# Patient Record
Sex: Male | Born: 2000 | Race: White | Hispanic: No | Marital: Single | Smoking: Never smoker
Health system: Southern US, Community
[De-identification: ages and names within clinical notes are randomized; demographics above are authoritative.]

## PROBLEM LIST (undated history)

## (undated) DIAGNOSIS — F902 Attention-deficit hyperactivity disorder, combined type: Secondary | ICD-10-CM

## (undated) HISTORY — DX: Attention-deficit hyperactivity disorder, combined type: F90.2

## (undated) HISTORY — PX: FRACTURE SURGERY: SHX138

---
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
## 2020-04-06 ENCOUNTER — Ambulatory Visit: Payer: BC Managed Care – PPO | Attending: Internal Medicine

## 2020-04-06 DIAGNOSIS — Z23 Encounter for immunization: Secondary | ICD-10-CM

## 2020-04-06 NOTE — Progress Notes (Signed)
   Covid-19 Vaccination Clinic  Name:  Neil Lane    MRN: [id number] DOB: [birth year]  04/06/2020  [name] was observed post Covid-19 immunization for 15 minutes without incident. He was provided with Vaccine Information Sheet and instruction to access the V-Safe system.   [name] was instructed to call 911 with any severe reactions post vaccine: Marland Kitchen Difficulty breathing  . Swelling of face and throat  . A fast heartbeat  . A bad rash all over body  . Dizziness and weakness   Immunizations Administered    Name Date Dose VIS Date Route   Pfizer COVID-19 Vaccine 04/06/2020  3:03 PM 0.3 mL 01/14/2019 Intramuscular   Manufacturer: [employer], [employer]   Lot: [id number]   NDC: [address]-7

## 2020-04-27 ENCOUNTER — Ambulatory Visit: Payer: BC Managed Care – PPO | Attending: Internal Medicine

## 2020-04-27 DIAGNOSIS — Z23 Encounter for immunization: Secondary | ICD-10-CM

## 2020-04-27 NOTE — Progress Notes (Signed)
   Covid-19 Vaccination Clinic  Name:  Neil Lane    MRN: [id number] DOB: 02-Apr-2001  04/27/2020  Neil Lane was observed post Covid-19 immunization for 15 minutes without incident. He was provided with Vaccine Information Sheet and instruction to access the V-Safe system.   Neil Lane was instructed to call 911 with any severe reactions post vaccine: Marland Kitchen Difficulty breathing  . Swelling of face and throat  . A fast heartbeat  . A bad rash all over body  . Dizziness and weakness   Immunizations Administered    Name Date Dose VIS Date Route   Pfizer COVID-19 Vaccine 04/27/2020 10:31 AM 0.3 mL 01/14/2019 Intramuscular   Manufacturer: [employer], [employer]   Lot: [id number]   NDC: [address]-1

## 2020-06-13 ENCOUNTER — Other Ambulatory Visit: Payer: Self-pay

## 2020-06-13 ENCOUNTER — Encounter: Payer: Self-pay | Admitting: Emergency Medicine

## 2020-06-13 ENCOUNTER — Emergency Department
Admission: EM | Admit: 2020-06-13 | Discharge: 2020-06-13 | Disposition: A | Discharge status: Home / Self Care | Payer: BC Managed Care – PPO | Attending: Emergency Medicine | Admitting: Emergency Medicine

## 2020-06-13 DIAGNOSIS — Z5321 Procedure and treatment not carried out due to patient leaving prior to being seen by health care provider: Secondary | ICD-10-CM | POA: Insufficient documentation

## 2020-06-13 DIAGNOSIS — R21 Rash and other nonspecific skin eruption: Secondary | ICD-10-CM | POA: Diagnosis not present

## 2020-06-13 DIAGNOSIS — T7840XA Allergy, unspecified, initial encounter: Secondary | ICD-10-CM | POA: Diagnosis not present

## 2020-06-13 MED ORDER — DIPHENHYDRAMINE HCL 25 MG PO CAPS
50.0000 mg | ORAL_CAPSULE | Freq: Once | ORAL | Status: AC
  Administered 2020-06-13: 50 mg via ORAL
  Filled 2020-06-13: qty 2

## 2020-06-13 NOTE — ED Triage Notes (Addendum)
Patient with complaint of allergic reaction that started about an hour ago. Patient with rash to his chest and bilateral arms.

## 2020-09-10 ENCOUNTER — Other Ambulatory Visit: Payer: Self-pay

## 2020-09-10 ENCOUNTER — Emergency Department
Admission: EM | Admit: 2020-09-10 | Discharge: 2020-09-10 | Disposition: A | Discharge status: Home / Self Care | Payer: BC Managed Care – PPO | Attending: Emergency Medicine | Admitting: Emergency Medicine

## 2020-09-10 ENCOUNTER — Emergency Department: Payer: BC Managed Care – PPO

## 2020-09-10 DIAGNOSIS — J40 Bronchitis, not specified as acute or chronic: Secondary | ICD-10-CM | POA: Diagnosis not present

## 2020-09-10 DIAGNOSIS — R531 Weakness: Secondary | ICD-10-CM | POA: Diagnosis present

## 2020-09-10 LAB — BASIC METABOLIC PANEL
Anion gap: 10 (ref 5–15)
BUN: 12 mg/dL (ref 6–20)
CO2: 23 mmol/L (ref 22–32)
Calcium: 9.3 mg/dL (ref 8.9–10.3)
Chloride: 104 mmol/L (ref 98–111)
Creatinine, Ser: 1.08 mg/dL (ref 0.61–1.24)
GFR, Estimated: >60 mL/min (ref >60)
Glucose, Bld: 104 mg/dL — ABNORMAL HIGH (ref 70–99)
Potassium: 3.8 mmol/L (ref 3.5–5.1)
Sodium: 137 mmol/L (ref 135–145)

## 2020-09-10 LAB — CBC
HCT: 43.7 % (ref 39.0–52.0)
Hemoglobin: 15.3 g/dL (ref 13.0–17.0)
MCH: 29.8 pg (ref 26.0–34.0)
MCHC: 35 g/dL (ref 30.0–36.0)
MCV: 85 fL (ref 80.0–100.0)
Platelets: 293 10*3/uL (ref 150–400)
RBC: 5.14 MIL/uL (ref 4.22–5.81)
RDW: 12.5 % (ref 11.5–15.5)
WBC: 12.1 10*3/uL — ABNORMAL HIGH (ref 4.0–10.5)
nRBC: 0 % (ref 0.0–0.2)

## 2020-09-10 MED ORDER — AZITHROMYCIN 250 MG PO TABS
ORAL_TABLET | ORAL | 0 refills | Status: DC
Start: 2020-09-10 — End: 2022-01-26

## 2020-09-10 MED ORDER — ACETAMINOPHEN 325 MG PO TABS
650.0000 mg | ORAL_TABLET | Freq: Once | ORAL | Status: AC | PRN
  Administered 2020-09-10: 650 mg via ORAL
  Filled 2020-09-10: qty 2

## 2020-09-10 MED ORDER — ALBUTEROL SULFATE HFA 108 (90 BASE) MCG/ACT IN AERS: 2.0000 | INHALATION_SPRAY | Freq: Four times a day (QID) | RESPIRATORY_TRACT | 0 refills | Status: AC | PRN

## 2020-09-10 NOTE — Discharge Instructions (Addendum)
Take the antibiotics as prescribed  Try the inhaler for SOB or chest pressure  Drink plenty of fluids  Follow COVID-19 precautions/quarantine until your results return  Take tylenol and ibuprofen for pain, fevers

## 2020-09-10 NOTE — ED Triage Notes (Signed)
Pt to ED POV for chief complaint weakness x1 day. Was COVID tested at clinic today, pending results.  States has been having intermittent SHOB. RR Even and unlabored.  Denies CP

## 2020-09-10 NOTE — ED Notes (Signed)
Discharge intructions reviewed with patient, AOx4, denied pain or sob. Pt declined medication for elevated fever.

## 2020-09-10 NOTE — ED Provider Notes (Signed)
[facility] Emergency Department Provider Note  ____________________________________________   First MD Initiated Contact with Patient 09/10/20 1953     (approximate)  I have reviewed the triage vital signs and the nursing notes.   HISTORY  Chief Complaint Weakness    HPI Neil Lane is a 19 y.o. male  With h/o ADHD here with SOB, chest pressure. Pt reports that over the past several days, he has had runny nose, mild fatigue. Over the past 24 hours, he's developed cough, mild chest pressure, and fatigue with chills. He has a possible COVID contact at work. He did receive two prior vaccines. Reports his pressure has improved since arriving. No h/o CAD or lung problems, but did use an inhaler when younger. He does not smoke. He ahd some mild n/v earlier today as well which has resolved. No abd pain. No dysuria. He was given tylenol in waiting room which has helped his symptoms.       Past Medical History:  Diagnosis Date  . ADHD (attention deficit hyperactivity disorder), combined type     There are no problems to display for this patient.   Past Surgical History:  Procedure Laterality Date  . FRACTURE SURGERY      Prior to Admission medications   Medication Sig Start Date End Date Taking? Authorizing Provider  albuterol (VENTOLIN HFA) 108 (90 Base) MCG/ACT inhaler Inhale 2 puffs into the lungs every 6 (six) hours as needed for wheezing or shortness of breath. 09/10/20   Shaune Pollack, MD  azithromycin (ZITHROMAX Z-PAK) 250 MG tablet Take 2 tablets (500 mg) on  Day 1,  followed by 1 tablet (250 mg) once daily on Days 2 through 5. 09/10/20   Shaune Pollack, MD  cloNIDine (CATAPRES) 0.2 MG tablet Take 0.2 mg by mouth once.    [provider]  dexmethylphenidate (FOCALIN XR) 20 MG 24 hr capsule Take 20 mg by mouth daily.    [provider]  dexmethylphenidate (FOCALIN) 5 MG tablet Take 5 mg by mouth 2 (two) times daily.     [provider]    Allergies Tricyclic antidepressants  No family history on file.  Social History Social History   Tobacco Use  . Smoking status: Never Smoker  . Smokeless tobacco: Never Used  Substance Use Topics  . Alcohol use: No  . Drug use: No    Review of Systems  Review of Systems  Constitutional: Positive for fatigue. Negative for chills and fever.  HENT: Negative for sore throat.   Respiratory: Positive for cough and shortness of breath.   Cardiovascular: Positive for chest pain.  Gastrointestinal: Positive for nausea. Negative for abdominal pain.  Genitourinary: Negative for flank pain.  Musculoskeletal: Negative for neck pain.  Skin: Negative for rash and wound.  Allergic/Immunologic: Negative for immunocompromised state.  Neurological: Negative for weakness and numbness.  Hematological: Does not bruise/bleed easily.  All other systems reviewed and are negative.    ____________________________________________  PHYSICAL EXAM:      VITAL SIGNS: ED Triage Vitals  Enc Vitals Group     BP 09/10/20 1753 (!) 122/96     Pulse Rate 09/10/20 1753 92     Resp 09/10/20 1753 20     Temp 09/10/20 1753 (!) 101.4 F (38.6 C)     Temp Source 09/10/20 1753 Oral     SpO2 09/10/20 1753 95 %     Weight 09/10/20 1754 228 lb (103.4 kg)     Height 09/10/20  1754 5\' 8"  (1.727 m)     Head Circumference --      Peak Flow --      Pain Score 09/10/20 1754 0     Pain Loc --      Pain Edu? --      Excl. in GC? --      Physical Exam Vitals and nursing note reviewed.  Constitutional:      General: He is not in acute distress.    Appearance: He is well-developed.  HENT:     Head: Normocephalic and atraumatic.  Eyes:     Conjunctiva/sclera: Conjunctivae normal.  Cardiovascular:     Rate and Rhythm: Normal rate and regular rhythm.     Heart sounds: Normal heart sounds. No murmur heard.  No friction rub.  Pulmonary:     Effort: Pulmonary effort is normal. No  respiratory distress.     Breath sounds: Wheezing (slight, expiratory) present. No rales.  Abdominal:     General: There is no distension.     Palpations: Abdomen is soft.     Tenderness: There is no abdominal tenderness.  Musculoskeletal:     Cervical back: Neck supple.  Skin:    General: Skin is warm.     Capillary Refill: Capillary refill takes less than 2 seconds.  Neurological:     Mental Status: He is alert and oriented to person, place, and time.     Motor: No abnormal muscle tone.       ____________________________________________   LABS (all labs ordered are listed, but only abnormal results are displayed)  Labs Reviewed  BASIC METABOLIC PANEL - Abnormal; Notable for the following components:      Result Value   Glucose, Bld 104 (*)    All other components within normal limits  CBC - Abnormal; Notable for the following components:   WBC 12.1 (*)    All other components within normal limits  URINALYSIS, COMPLETE (UACMP) WITH MICROSCOPIC    ____________________________________________  EKG: Sinus tachycardia, VR 103. PR 136, QRS 84, QTc 387. Rightward axis, o/w no significant abnormalities. ________________________________________  RADIOLOGY All imaging, including plain films, CT scans, and ultrasounds, independently reviewed by me, and interpretations confirmed via formal radiology reads.  ED MD interpretation:   CXR: no active cardiopulm dz, possible deformity of R proximal humerus  Official radiology report(s): DG Chest 2 View  Result Date: 09/10/2020 CLINICAL DATA:  Initial evaluation for acute shortness of breath. EXAM: CHEST - 2 VIEW COMPARISON:  None available. FINDINGS: The cardiac and mediastinal silhouettes are within normal limits. The lungs are normally inflated. No airspace consolidation, pleural effusion, or pulmonary edema. No pneumothorax. No acute osseous abnormality. Deformity and thickening noted about the partially visualized proximal right  humerus, indeterminate. IMPRESSION: 1. No active cardiopulmonary disease. 2. Deformity and thickening about the partially visualized proximal right humerus, indeterminate. Correlation with history and physical exam recommended. Further assessment with dedicated radiograph could be performed as clinically warranted. Electronically Signed   By: 09/12/2020 M.D.   On: 09/10/2020 19:05   DG Humerus Right  Result Date: 09/10/2020 CLINICAL DATA:  Abnormal humeral head on x-ray EXAM: RIGHT HUMERUS - 2+ VIEW COMPARISON:  None. FINDINGS: There is no evidence of fracture or dislocation. There is slight osseous protrude rinse and healed deformity seen within the proximal humerus. No focal overlying soft tissue swelling. IMPRESSION: Probable healed fracture deformity of the proximal humerus within osseous protuberance which could be exostosis versus partially resected osteochondroma given the patient's  history. Electronically Signed   By: Jonna Clark M.D.   On: 09/10/2020 20:47    ____________________________________________  PROCEDURES   Procedure(s) performed (including Critical Care):  Procedures  ____________________________________________  INITIAL IMPRESSION / MDM / ASSESSMENT AND PLAN / ED COURSE  As part of my medical decision making, I reviewed the following data within the electronic MEDICAL RECORD NUMBER Nursing notes reviewed and incorporated, Old chart reviewed, Notes from prior ED visits, and [place] Controlled Substance Database       *Neil SPRAY V. was evaluated in Emergency Department on 09/10/2020 for the symptoms described in the history of present illness. He was evaluated in the context of the global COVID-19 pandemic, which necessitated consideration that the patient might be at risk for infection with the SARS-CoV-2 virus that causes COVID-19. Institutional protocols and algorithms that pertain to the evaluation of patients at risk for COVID-19 are in a state of rapid change  based on information released by regulatory bodies including the CDC and federal and state organizations. These policies and algorithms were followed during the patient's care in the ED.  Some ED evaluations and interventions may be delayed as a result of limited staffing during the pandemic.*     Medical Decision Making:  19 yo M here with fever, cough, fatigue. Suspect COVID-19 vs viral URI vs early bronchitis/CAP. He is non-toxic, well appearing with sats >95% on RA. CXR shows no focal PNA. He has incidental note of irregular humerus which I suspect is from his h/o osteochondroma. No concerning feature on XR. Otherwise, he has a mild leukocytosis but normal labs otherwise. Renal function is normal. Will tx for early bronchitis/CAP, d/c with outpt follow-up. Given absence of hypoxia, will hold on steroids at this time. Will trial albuterol given his mild wheezing.  ____________________________________________  FINAL CLINICAL IMPRESSION(S) / ED DIAGNOSES  Final diagnoses:  Bronchitis     MEDICATIONS GIVEN DURING THIS VISIT:  Medications  acetaminophen (TYLENOL) tablet 650 mg (650 mg Oral Given 09/10/20 1801)     ED Discharge Orders         Ordered    azithromycin (ZITHROMAX Z-PAK) 250 MG tablet        09/10/20 2125    albuterol (VENTOLIN HFA) 108 (90 Base) MCG/ACT inhaler  Every 6 hours PRN        09/10/20 2125           Note:  This document was prepared using Dragon voice recognition software and may include unintentional dictation errors.   Shaune Pollack, MD 09/10/20 2125

## 2022-01-26 ENCOUNTER — Encounter: Payer: Self-pay | Admitting: Urgent Care

## 2022-01-26 ENCOUNTER — Other Ambulatory Visit: Payer: Self-pay

## 2022-01-26 ENCOUNTER — Ambulatory Visit: Payer: Self-pay

## 2022-01-26 ENCOUNTER — Ambulatory Visit
Admission: EM | Admit: 2022-01-26 | Discharge: 2022-01-26 | Disposition: A | Discharge status: Home / Self Care | Payer: BC Managed Care – PPO

## 2022-01-26 DIAGNOSIS — R0602 Shortness of breath: Secondary | ICD-10-CM | POA: Diagnosis not present

## 2022-01-26 DIAGNOSIS — R030 Elevated blood-pressure reading, without diagnosis of hypertension: Secondary | ICD-10-CM

## 2022-01-26 DIAGNOSIS — Z833 Family history of diabetes mellitus: Secondary | ICD-10-CM | POA: Diagnosis not present

## 2022-01-26 DIAGNOSIS — R052 Subacute cough: Secondary | ICD-10-CM | POA: Diagnosis not present

## 2022-01-26 NOTE — ED Triage Notes (Signed)
Pt reports cough and shortness of breath x 16 hrs after he was burning some papers and trash.  ?
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
## 2022-01-30 ENCOUNTER — Encounter (HOSPITAL_COMMUNITY): Payer: Self-pay | Admitting: *Deleted

## 2022-01-30 ENCOUNTER — Emergency Department (HOSPITAL_COMMUNITY)
Admission: EM | Admit: 2022-01-30 | Discharge: 2022-01-30 | Disposition: A | Discharge status: Home / Self Care | Payer: BC Managed Care – PPO | Attending: Emergency Medicine | Admitting: Emergency Medicine

## 2022-01-30 DIAGNOSIS — R059 Cough, unspecified: Secondary | ICD-10-CM | POA: Insufficient documentation

## 2022-01-30 DIAGNOSIS — R112 Nausea with vomiting, unspecified: Secondary | ICD-10-CM | POA: Diagnosis present

## 2022-01-30 DIAGNOSIS — R197 Diarrhea, unspecified: Secondary | ICD-10-CM | POA: Diagnosis not present

## 2022-01-30 DIAGNOSIS — R109 Unspecified abdominal pain: Secondary | ICD-10-CM | POA: Diagnosis not present

## 2022-01-30 DIAGNOSIS — D72829 Elevated white blood cell count, unspecified: Secondary | ICD-10-CM | POA: Insufficient documentation

## 2022-01-30 DIAGNOSIS — M549 Dorsalgia, unspecified: Secondary | ICD-10-CM | POA: Diagnosis not present

## 2022-01-30 DIAGNOSIS — M791 Myalgia, unspecified site: Secondary | ICD-10-CM | POA: Insufficient documentation

## 2022-01-30 DIAGNOSIS — Z20822 Contact with and (suspected) exposure to covid-19: Secondary | ICD-10-CM | POA: Diagnosis not present

## 2022-01-30 DIAGNOSIS — R000 Tachycardia, unspecified: Secondary | ICD-10-CM | POA: Insufficient documentation

## 2022-01-30 LAB — URINALYSIS, ROUTINE W REFLEX MICROSCOPIC
Bacteria, UA: NONE SEEN
Bilirubin Urine: NEGATIVE
Glucose, UA: NEGATIVE mg/dL
Hgb urine dipstick: NEGATIVE
Ketones, ur: NEGATIVE mg/dL
Leukocytes,Ua: NEGATIVE
Nitrite: NEGATIVE
Protein, ur: 30 mg/dL — AB
Specific Gravity, Urine: 1.032 — ABNORMAL HIGH (ref 1.005–1.030)
pH: 5 (ref 5.0–8.0)

## 2022-01-30 LAB — CBC WITH DIFFERENTIAL/PLATELET
Abs Immature Granulocytes: 0.05 10*3/uL (ref 0.00–0.07)
Basophils Absolute: 0 10*3/uL (ref 0.0–0.1)
Basophils Relative: 0 %
Eosinophils Absolute: 0 10*3/uL (ref 0.0–0.5)
Eosinophils Relative: 0 %
HCT: 45.9 % (ref 39.0–52.0)
Hemoglobin: 16.1 g/dL (ref 13.0–17.0)
Immature Granulocytes: 0 %
Lymphocytes Relative: 3 %
Lymphs Abs: 0.4 10*3/uL — ABNORMAL LOW (ref 0.7–4.0)
MCH: 30.6 pg (ref 26.0–34.0)
MCHC: 35.1 g/dL (ref 30.0–36.0)
MCV: 87.1 fL (ref 80.0–100.0)
Monocytes Absolute: 0.6 10*3/uL (ref 0.1–1.0)
Monocytes Relative: 4 %
Neutro Abs: 12.1 10*3/uL — ABNORMAL HIGH (ref 1.7–7.7)
Neutrophils Relative %: 93 %
Platelets: 287 10*3/uL (ref 150–400)
RBC: 5.27 MIL/uL (ref 4.22–5.81)
RDW: 12.7 % (ref 11.5–15.5)
WBC: 13.2 10*3/uL — ABNORMAL HIGH (ref 4.0–10.5)
nRBC: 0 % (ref 0.0–0.2)

## 2022-01-30 LAB — COMPREHENSIVE METABOLIC PANEL
ALT: 46 U/L — ABNORMAL HIGH (ref 0–44)
AST: 35 U/L (ref 15–41)
Albumin: 4.5 g/dL (ref 3.5–5.0)
Alkaline Phosphatase: 80 U/L (ref 38–126)
Anion gap: 11 (ref 5–15)
BUN: 22 mg/dL — ABNORMAL HIGH (ref 6–20)
CO2: 23 mmol/L (ref 22–32)
Calcium: 9.2 mg/dL (ref 8.9–10.3)
Chloride: 105 mmol/L (ref 98–111)
Creatinine, Ser: 1.02 mg/dL (ref 0.61–1.24)
GFR, Estimated: >60 mL/min (ref >60)
Glucose, Bld: 106 mg/dL — ABNORMAL HIGH (ref 70–99)
Potassium: 4.1 mmol/L (ref 3.5–5.1)
Sodium: 139 mmol/L (ref 135–145)
Total Bilirubin: 1.3 mg/dL — ABNORMAL HIGH (ref 0.3–1.2)
Total Protein: 7.6 g/dL (ref 6.5–8.1)

## 2022-01-30 LAB — LIPASE, BLOOD: Lipase: 27 U/L (ref 11–51)

## 2022-01-30 LAB — RESP PANEL BY RT-PCR (FLU A&B, COVID) ARPGX2
Influenza A by PCR: NEGATIVE
Influenza B by PCR: NEGATIVE
SARS Coronavirus 2 by RT PCR: NEGATIVE

## 2022-01-30 MED ORDER — ONDANSETRON HCL 4 MG PO TABS: 4.0000 mg | ORAL_TABLET | Freq: Four times a day (QID) | ORAL | 0 refills | Status: DC

## 2022-01-30 MED ORDER — SODIUM CHLORIDE 0.9 % IV BOLUS
1000.0000 mL | Freq: Once | INTRAVENOUS | Status: AC
  Administered 2022-01-30: 1000 mL via INTRAVENOUS

## 2022-01-30 MED ORDER — ONDANSETRON HCL 4 MG/2ML IJ SOLN
4.0000 mg | Freq: Once | INTRAMUSCULAR | Status: AC
  Administered 2022-01-30: 4 mg via INTRAVENOUS
  Filled 2022-01-30: qty 2

## 2022-01-30 NOTE — ED Triage Notes (Signed)
Fever with nausea and vomting ?

## 2022-01-30 NOTE — Discharge Instructions (Signed)
You were seen in the emergency department today for nausea, vomiting and diarrhea.  While you are here we did lab work which was relatively reassuring.  We also gave you fluids and Zofran which seemed to improve your symptoms greatly.  Please return to the emergency department for worsening abdominal pain with fevers, nausea and vomiting despite using Zofran.  Additionally return if you are not able to drink water. ?

## 2022-01-30 NOTE — ED Notes (Signed)
Vitals entered in error. 

## 2022-01-30 NOTE — ED Provider Notes (Cosign Needed)
[facility] EMERGENCY DEPARTMENT Provider Note   CSN: [id number] Arrival date & time: 01/30/22  1327     History  Chief Complaint  Patient presents with   nausea and vomiting    Neil Lane. is a 21 y.o. male.  With no pertinent past medical history presents to the emergency department with nausea and vomiting.  Patient states that symptoms began this morning.  He states that he woke up with abdominal, and back pain.  He states that he has also had numerous episodes of nausea, vomiting as well as body aches and cough.  Additionally has had few episodes of diarrhea.  Denies blood in his stool.  He denies fever at home but presents with temp of 99.7.  Denies palpitations, lower extremity swelling, shortness of breath.  He states that he ate dominoes last night as well as his sister who he states was somewhat ill on her stomach however does not have symptoms as bad as him.  HPI     Home Medications Prior to Admission medications   Medication Sig Start Date End Date Taking? Authorizing Provider  albuterol (VENTOLIN HFA) 108 (90 Base) MCG/ACT inhaler Inhale 2 puffs into the lungs every 6 (six) hours as needed for wheezing or shortness of breath. 09/10/20   Shaune Pollack, MD  cloNIDine (CATAPRES) 0.2 MG tablet Take 0.2 mg by mouth once.    [provider]  dexmethylphenidate (FOCALIN XR) 20 MG 24 hr capsule Take 20 mg by mouth daily.    [provider]  dexmethylphenidate (FOCALIN) 5 MG tablet Take 5 mg by mouth 2 (two) times daily.    [provider]      Allergies    Oxcarbazepine and Tricyclic antidepressants    Review of Systems   Review of Systems  Constitutional:  Positive for appetite change.  HENT:  Negative for congestion and sore throat.   Respiratory:  Positive for cough and shortness of breath.   Cardiovascular:  Negative for palpitations and leg swelling.  Gastrointestinal:  Positive for abdominal pain, diarrhea, nausea and vomiting.  Negative for blood in stool.  Genitourinary:  Negative for dysuria.  Musculoskeletal:  Positive for myalgias.  Neurological:  Negative for dizziness, syncope and light-headedness.  All other systems reviewed and are negative.  Physical Exam Updated Vital Signs BP (!) 145/71    Pulse (!) 130    Temp 99.7 F (37.6 C) (Oral)    Resp (!) 30    Wt (!) 233.6 kg    SpO2 100%    BMI 78.30 kg/m  Physical Exam Vitals and nursing note reviewed.  Constitutional:      General: He is not in acute distress.    Appearance: Normal appearance. He is ill-appearing. He is not toxic-appearing.  HENT:     Head: Normocephalic and atraumatic.     Nose: Nose normal.     Mouth/Throat:     Mouth: Mucous membranes are moist.     Pharynx: Oropharynx is clear. No posterior oropharyngeal erythema.  Eyes:     General: No scleral icterus.    Extraocular Movements: Extraocular movements intact.     Conjunctiva/sclera: Conjunctivae normal.  Cardiovascular:     Rate and Rhythm: Regular rhythm. Tachycardia present.     Pulses: Normal pulses.     Heart sounds: Normal heart sounds. No murmur heard. Pulmonary:     Effort: Pulmonary effort is normal. No respiratory distress.     Breath sounds: Normal breath sounds.  Abdominal:  General: Bowel sounds are normal. There is no distension.     Palpations: Abdomen is soft.     Tenderness: There is no abdominal tenderness. There is no guarding.  Musculoskeletal:        General: Normal range of motion.     Cervical back: Neck supple. No tenderness.  Skin:    General: Skin is warm and dry.     Capillary Refill: Capillary refill takes less than 2 seconds.  Neurological:     General: No focal deficit present.     Mental Status: He is alert and oriented to person, place, and time. Mental status is at baseline.  Psychiatric:        Mood and Affect: Mood normal.        Behavior: Behavior normal.        Thought Content: Thought content normal.        Judgment: Judgment  normal.    ED Results / Procedures / Treatments   Labs (all labs ordered are listed, but only abnormal results are displayed) Labs Reviewed  COMPREHENSIVE METABOLIC PANEL - Abnormal; Notable for the following components:      Result Value   Glucose, Bld 106 (*)    BUN 22 (*)    ALT 46 (*)    Total Bilirubin 1.3 (*)    All other components within normal limits  CBC WITH DIFFERENTIAL/PLATELET - Abnormal; Notable for the following components:   WBC 13.2 (*)    Neutro Abs 12.1 (*)    Lymphs Abs 0.4 (*)    All other components within normal limits  URINALYSIS, ROUTINE W REFLEX MICROSCOPIC - Abnormal; Notable for the following components:   Specific Gravity, Urine 1.032 (*)    Protein, ur 30 (*)    All other components within normal limits  RESP PANEL BY RT-PCR (FLU A&B, COVID) ARPGX2  LIPASE, BLOOD   EKG None  Radiology No results found.  Procedures Procedures   Medications Ordered in ED Medications  sodium chloride 0.9 % bolus 1,000 mL (0 mLs Intravenous Stopped 01/30/22 1634)  ondansetron (ZOFRAN) injection 4 mg (4 mg Intravenous Given 01/30/22 1510)    ED Course/ Medical Decision Making/ A&P                           Medical Decision Making Amount and/or Complexity of Data Reviewed Labs: ordered.  Risk Prescription drug management.  This patient presents to the ED for concern of abdominal pain, this involves an extensive number of treatment options, and is a complaint that carries with it a high risk of complications and morbidity.  The differential diagnosis includes hepatobiliary disease, pancreatitis, PUD, pneumonia, hepatitis, pyelonephritis, appendicitis, vascular catastrophe, bowel obstruction, viscus perforation, torsion, diverticulitis   Co morbidities that complicate the patient evaluation None  Additional history obtained:  Additional history obtained from: None External records from outside source obtained and reviewed including: Previous ED  visits  Lab Results: I personally ordered, reviewed, and interpreted labs. Pertinent results include: CMP with very mild elevated T. bili to 1.3, no AKI, transaminitis CBC with leukocytosis to 13.2, likely reactive COVID and flu negative Lipase 27, negative UA negative  Medications  I ordered medication including Zofran and fluids for vomiting and dehydration Reevaluation of the patient after medication shows that patient resolved  Tests Considered: CT abdomen pelvis with contrast  ED Course: 21 year old male who presents emergency department with nausea and vomiting and diarrhea.  His abdominal exam is  without peritoneal signs.  There is no evidence of an acute abdomen.  He is well-appearing.  Given his work-up I have low suspicion for acute hepatobiliary disease including acute cholecystitis or cholangitis.  Lipase is negative, doubt acute pancreatitis.  Symptoms are not consistent with PUD.  No cough, fever to suspect pneumonia.  CMP without transaminitis so doubt hepatitis.  UA is clean with no flank pain, fevers concerning for pyelonephritis. Would be atypical appendicitis however do not think his symptoms are consistent with this at this time.  Doubt vascular catastrophe, bowel obstruction, viscus perforation, diverticulitis based on symptoms.  Symptoms are also not consistent with testicular torsion.  Likely has viral gastroenteritis.  He was given 1 L IV fluids as well as 4 mg of Zofran with significant improvement in his symptoms.  Given his improvement in symptoms, will not scan him at this time.  He was also p.o. trialed without difficulty.  He states that he is feeling much improved.  We will give him a prescription for Zofran for home.  I have given him return precautions for worsening abdominal pain with fever or intractable nausea and vomiting.  He verbalized understanding.  After consideration of the diagnostic results and the patients response to treatment, I feel that the  patent would benefit from discharge. The patient has been appropriately medically screened and/or stabilized in the ED. I have low suspicion for any other emergent medical condition which would require further screening, evaluation or treatment in the ED or require inpatient management. The patient is overall well appearing and non-toxic in appearance. They are hemodynamically stable at time of discharge.   Final Clinical Impression(s) / ED Diagnoses Final diagnoses:  Nausea and vomiting, unspecified vomiting type    Rx / DC Orders ED Discharge Orders          Ordered    ondansetron (ZOFRAN) 4 MG tablet  Every 6 hours        01/30/22 1709              Cristopher Peru, PA-C 01/30/22 1747

## 2022-02-25 ENCOUNTER — Emergency Department (HOSPITAL_COMMUNITY)
Admission: EM | Admit: 2022-02-25 | Discharge: 2022-02-25 | Disposition: A | Discharge status: Home / Self Care | Payer: BC Managed Care – PPO | Attending: Emergency Medicine | Admitting: Emergency Medicine

## 2022-02-25 ENCOUNTER — Encounter (HOSPITAL_COMMUNITY): Payer: Self-pay

## 2022-02-25 ENCOUNTER — Other Ambulatory Visit: Payer: Self-pay

## 2022-02-25 DIAGNOSIS — R197 Diarrhea, unspecified: Secondary | ICD-10-CM | POA: Diagnosis not present

## 2022-02-25 DIAGNOSIS — R109 Unspecified abdominal pain: Secondary | ICD-10-CM | POA: Diagnosis not present

## 2022-02-25 DIAGNOSIS — R7401 Elevation of levels of liver transaminase levels: Secondary | ICD-10-CM | POA: Insufficient documentation

## 2022-02-25 DIAGNOSIS — R112 Nausea with vomiting, unspecified: Secondary | ICD-10-CM | POA: Insufficient documentation

## 2022-02-25 LAB — COMPREHENSIVE METABOLIC PANEL
ALT: 57 U/L — ABNORMAL HIGH (ref 0–44)
AST: 34 U/L (ref 15–41)
Albumin: 4.7 g/dL (ref 3.5–5.0)
Alkaline Phosphatase: 83 U/L (ref 38–126)
Anion gap: 8 (ref 5–15)
BUN: 18 mg/dL (ref 6–20)
CO2: 24 mmol/L (ref 22–32)
Calcium: 9.4 mg/dL (ref 8.9–10.3)
Chloride: 107 mmol/L (ref 98–111)
Creatinine, Ser: 0.91 mg/dL (ref 0.61–1.24)
GFR, Estimated: >60 mL/min (ref >60)
Glucose, Bld: 99 mg/dL (ref 70–99)
Potassium: 4 mmol/L (ref 3.5–5.1)
Sodium: 139 mmol/L (ref 135–145)
Total Bilirubin: 1.2 mg/dL (ref 0.3–1.2)
Total Protein: 7.8 g/dL (ref 6.5–8.1)

## 2022-02-25 LAB — CBC WITH DIFFERENTIAL/PLATELET
Abs Immature Granulocytes: 0.05 10*3/uL (ref 0.00–0.07)
Basophils Absolute: 0 10*3/uL (ref 0.0–0.1)
Basophils Relative: 0 %
Eosinophils Absolute: 0.1 10*3/uL (ref 0.0–0.5)
Eosinophils Relative: 1 %
HCT: 46.9 % (ref 39.0–52.0)
Hemoglobin: 16.4 g/dL (ref 13.0–17.0)
Immature Granulocytes: 0 %
Lymphocytes Relative: 10 %
Lymphs Abs: 1.2 10*3/uL (ref 0.7–4.0)
MCH: 29.7 pg (ref 26.0–34.0)
MCHC: 35 g/dL (ref 30.0–36.0)
MCV: 85 fL (ref 80.0–100.0)
Monocytes Absolute: 0.6 10*3/uL (ref 0.1–1.0)
Monocytes Relative: 5 %
Neutro Abs: 10.3 10*3/uL — ABNORMAL HIGH (ref 1.7–7.7)
Neutrophils Relative %: 84 %
Platelets: 310 10*3/uL (ref 150–400)
RBC: 5.52 MIL/uL (ref 4.22–5.81)
RDW: 12.8 % (ref 11.5–15.5)
WBC: 12.3 10*3/uL — ABNORMAL HIGH (ref 4.0–10.5)
nRBC: 0 % (ref 0.0–0.2)

## 2022-02-25 LAB — LIPASE, BLOOD: Lipase: 30 U/L (ref 11–51)

## 2022-02-25 MED ORDER — LACTATED RINGERS IV BOLUS
1000.0000 mL | Freq: Once | INTRAVENOUS | Status: AC
  Administered 2022-02-25: 1000 mL via INTRAVENOUS

## 2022-02-25 MED ORDER — ONDANSETRON HCL 4 MG PO TABS: 4.0000 mg | ORAL_TABLET | Freq: Four times a day (QID) | ORAL | 0 refills | Status: DC

## 2022-02-25 MED ORDER — ONDANSETRON HCL 4 MG/2ML IJ SOLN
4.0000 mg | Freq: Once | INTRAMUSCULAR | Status: AC
  Administered 2022-02-25: 4 mg via INTRAVENOUS
  Filled 2022-02-25: qty 2

## 2022-02-25 MED ORDER — LOPERAMIDE HCL 2 MG PO CAPS
4.0000 mg | ORAL_CAPSULE | Freq: Once | ORAL | Status: AC
  Administered 2022-02-25: 4 mg via ORAL
  Filled 2022-02-25: qty 2

## 2022-02-25 NOTE — Discharge Instructions (Signed)
Take loperamide (Imodium A-D) as needed for diarrhea. ?

## 2022-02-25 NOTE — ED Triage Notes (Signed)
Pt arrived via POV c/o N/V/D with abdominal pain. Pt ambulatory to room for Triage and able to provide urine specimen. Pt reports symptoms began last night @ apprx 2230. Pt reports taking Pepto Bismul PTA without relief from symptoms. ?

## 2022-02-25 NOTE — ED Provider Notes (Signed)
?[facility] EMERGENCY DEPARTMENT ?Provider Note ? ? ?CSN: [id number] ?Arrival date & time: 02/25/22  0156 ? ?  ? ?History ? ?Chief Complaint  ?Patient presents with  ? Abdominal Pain  ? ? ?Belva Bertin is a 21 y.o. male. ? ?The history is provided by the patient.  ?Abdominal Pain ?He had onset about 10 PM nausea, vomiting, diarrhea.  There was some associated abdominal pain which has resolved.  He estimates more than 10 episodes of diarrhea, numerous episodes of emesis.  He denies fever, chills, sweats.  He denies any sick contacts but states that he ate something from [employer] and he was worried that the locations cleanliness was not adequate.  He did take Pepto-Bismol at home without any benefit. ?  ?Home Medications ?Prior to Admission medications   ?Medication Sig Start Date End Date Taking? Authorizing Provider  ?albuterol (VENTOLIN HFA) 108 (90 Base) MCG/ACT inhaler Inhale 2 puffs into the lungs every 6 (six) hours as needed for wheezing or shortness of breath. 09/10/20   Shaune Pollack, MD  ?cloNIDine (CATAPRES) 0.2 MG tablet Take 0.2 mg by mouth once.    [provider]  ?dexmethylphenidate (FOCALIN XR) 20 MG 24 hr capsule Take 20 mg by mouth daily.    [provider]  ?dexmethylphenidate (FOCALIN) 5 MG tablet Take 5 mg by mouth 2 (two) times daily.    [provider]  ?ondansetron (ZOFRAN) 4 MG tablet Take 1 tablet (4 mg total) by mouth every 6 (six) hours. 01/30/22   Cristopher Peru, PA-C  ?   ? ?Allergies    ?Oxcarbazepine and Tricyclic antidepressants   ? ?Review of Systems   ?Review of Systems  ?Gastrointestinal:  Positive for abdominal pain.  ?All other systems reviewed and are negative. ? ?Physical Exam ?Updated Vital Signs ?BP 137/65 (BP Location: Left Arm)   Pulse 70   Temp 98.8 ?F (37.1 ?C) (Oral)   Resp 18   Ht 5\' 8"  (1.727 m)   Wt 106.6 kg   SpO2 100%   BMI 35.73 kg/m?  ?Physical Exam ?Vitals and nursing note reviewed.  ?21 year old male, resting comfortably  and in no acute distress. Vital signs are normal. Oxygen saturation is 100%, which is normal. ?Head is normocephalic and atraumatic. PERRLA, EOMI. Oropharynx is clear. ?Neck is nontender and supple without adenopathy or JVD. ?Back is nontender and there is no CVA tenderness. ?Lungs are clear without rales, wheezes, or rhonchi. ?Chest is nontender. ?Heart has regular rate and rhythm without murmur. ?Abdomen is soft, flat, nontender without masses or hepatosplenomegaly and peristalsis is normoactive. ?Extremities have no cyanosis or edema, full range of motion is present. ?Skin is warm and dry without rash. ?Neurologic: Mental status is normal, cranial nerves are intact, moves all extremities equally. ? ?ED Results / Procedures / Treatments   ?Labs ?(all labs ordered are listed, but only abnormal results are displayed) ?Labs Reviewed  ?COMPREHENSIVE METABOLIC PANEL  ?LIPASE, BLOOD  ?CBC WITH DIFFERENTIAL/PLATELET  ? ?Procedures ?Procedures  ? ? ?Medications Ordered in ED ?Medications  ?ondansetron (ZOFRAN) injection 4 mg (has no administration in time range)  ?lactated ringers bolus 1,000 mL (has no administration in time range)  ?loperamide (IMODIUM) capsule 4 mg (has no administration in time range)  ? ? ?ED Course/ Medical Decision Making/ A&P ?  ?                        ?Medical Decision Making ?Amount  and/or Complexity of Data Reviewed ?Labs: ordered. ? ?Risk ?Prescription drug management. ? ? ?Nausea, vomiting, diarrhea and pattern strongly suggestive of either viral gastroenteritis or food poisoning.  No red flags to suggest more serious pathology such as bowel obstruction, appendicitis, diverticulitis, pancreatitis.  We will check screening labs including lipase and hepatic function studies and give IV fluids.  He will be given ondansetron for nausea, loperamide for diarrhea.  Old records are reviewed, and he had an ED visit for similar complaints about 1 month ago. ? ?Labs show mildly elevated AST, not  significantly changed from recent value.  He feels much better after above-noted treatment.  He is discharged with prescription for ondansetron and told to use over-the-counter loperamide as needed for diarrhea. ? ?Final Clinical Impression(s) / ED Diagnoses ?Final diagnoses:  ?Nausea vomiting and diarrhea  ?Elevated AST (SGOT)  ? ? ?Rx / DC Orders ?ED Discharge Orders   ? ?      Ordered  ?  ondansetron (ZOFRAN) 4 MG tablet  Every 6 hours       ? 02/25/22 0433  ? ?  ?  ? ?  ? ? ?  ?Dione Booze, MD ?02/25/22 [phone] ? ?

## 2022-04-20 ENCOUNTER — Other Ambulatory Visit: Payer: Self-pay

## 2022-04-20 ENCOUNTER — Emergency Department (HOSPITAL_COMMUNITY)
Admission: EM | Admit: 2022-04-20 | Discharge: 2022-04-21 | Disposition: A | Discharge status: Home / Self Care | Payer: BC Managed Care – PPO | Attending: Emergency Medicine | Admitting: Emergency Medicine

## 2022-04-20 ENCOUNTER — Encounter (HOSPITAL_COMMUNITY): Payer: Self-pay | Admitting: Emergency Medicine

## 2022-04-20 DIAGNOSIS — M545 Low back pain, unspecified: Secondary | ICD-10-CM | POA: Diagnosis not present

## 2022-04-20 DIAGNOSIS — Y92410 Unspecified street and highway as the place of occurrence of the external cause: Secondary | ICD-10-CM | POA: Diagnosis not present
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
## 2022-06-20 IMAGING — CR DG CHEST 2V
1 series · 3 of 3 positions shown · non-contrast
Comparison: None available.

CLINICAL DATA: Initial evaluation for acute shortness of breath.

EXAM:
CHEST - 2 VIEW

[Series 1: w chest pa · 0.14mm/px · 3 of 3 slices shown]
[im 1/3]
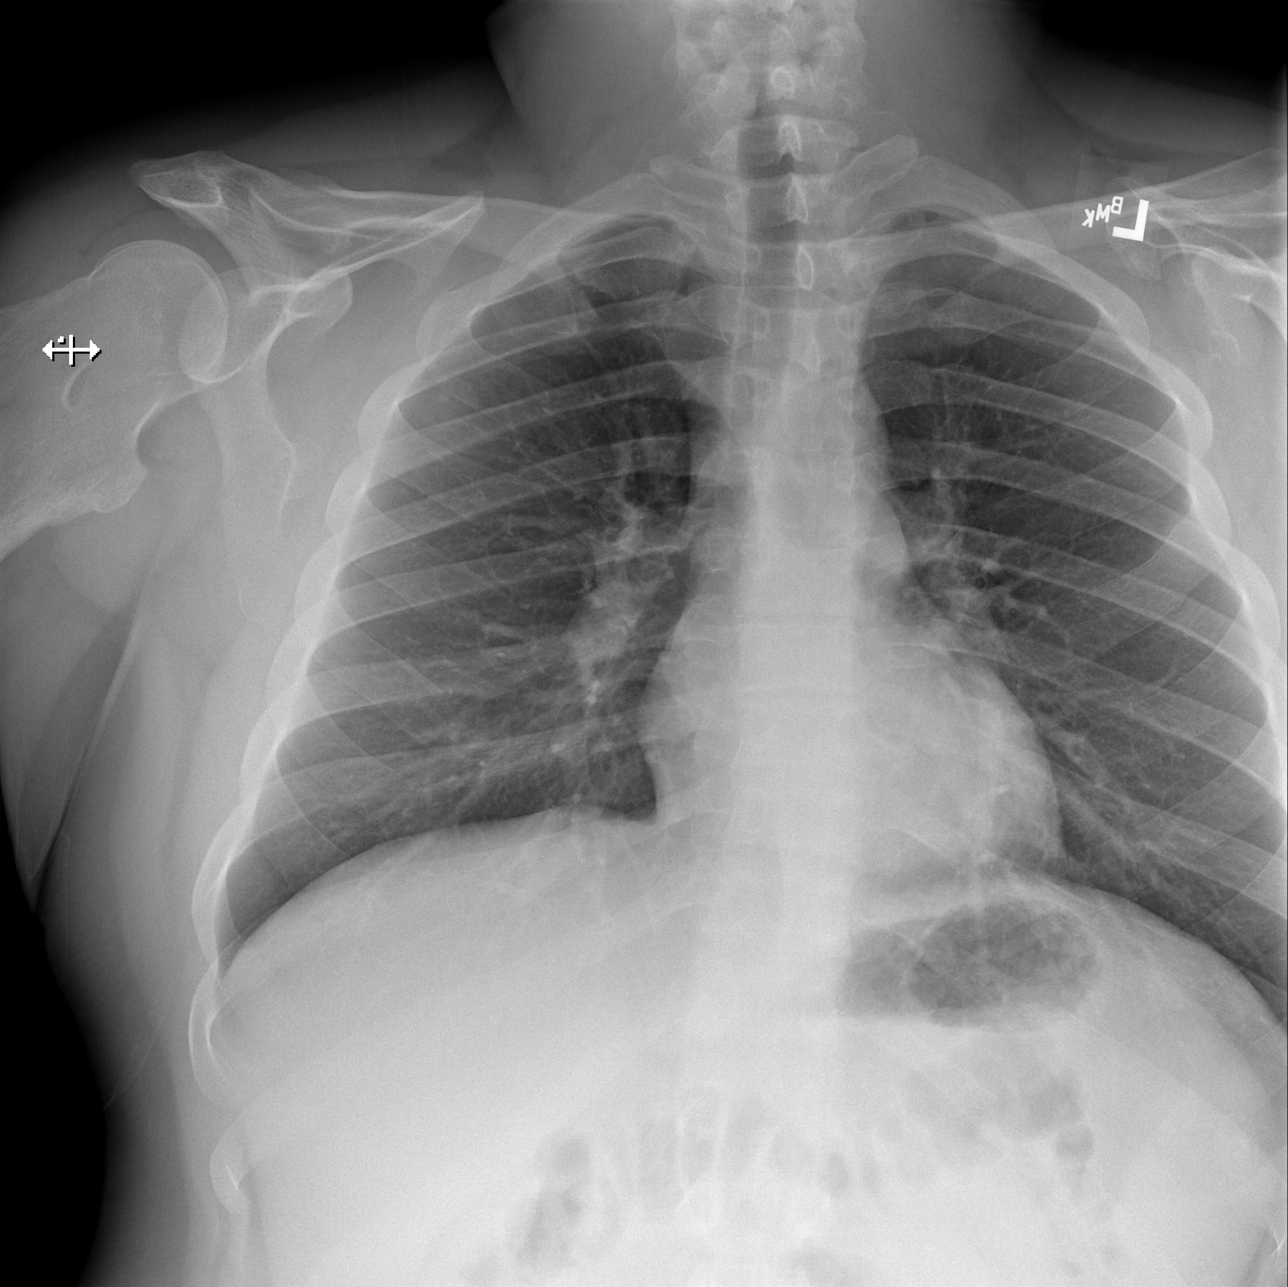
[im 2/3]
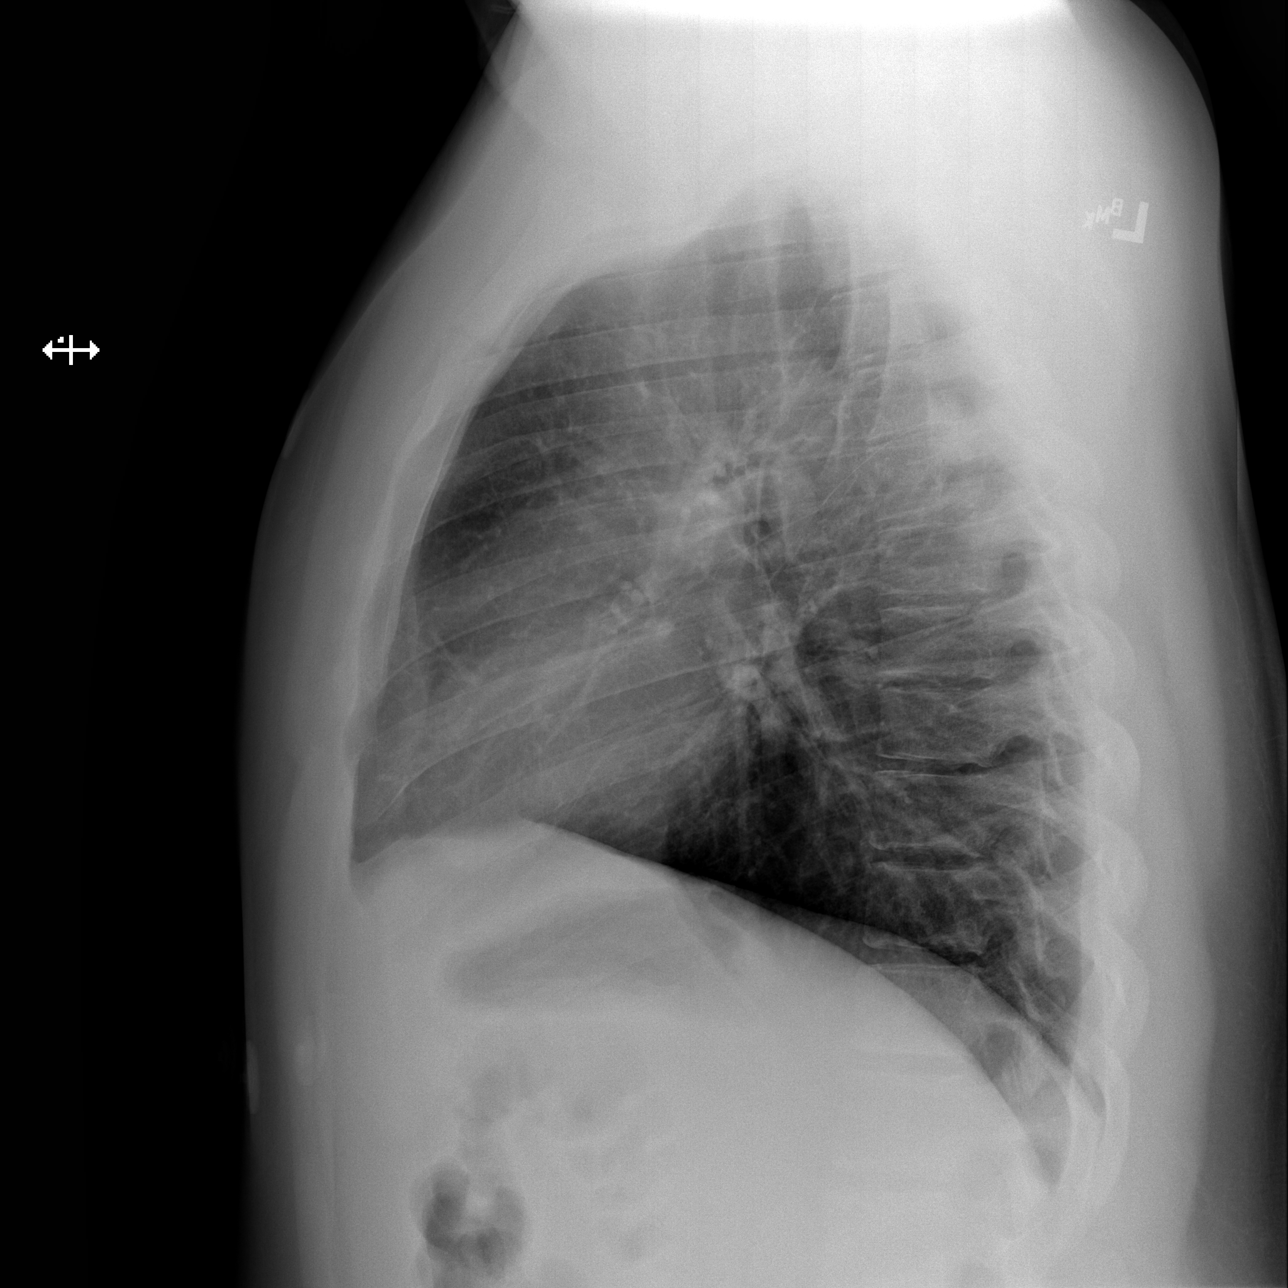
[im 3/3]
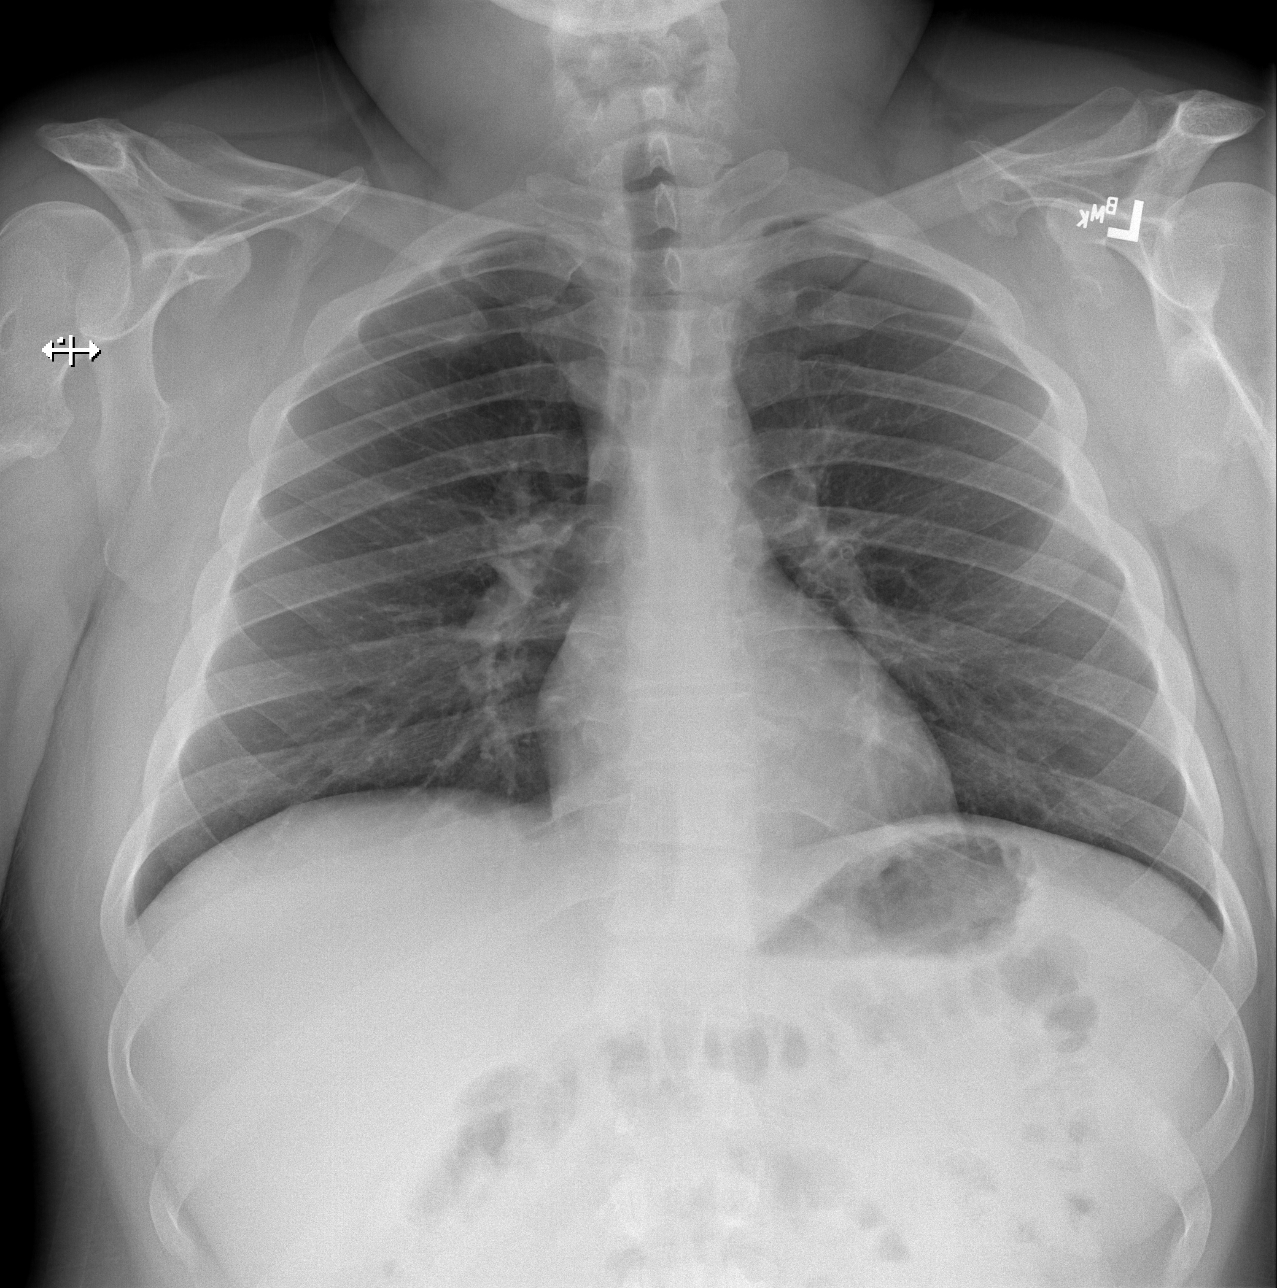

[3 of 3 positions shown; findings below may reference images not displayed]

FINDINGS: The cardiac and mediastinal silhouettes are within normal limits.

The lungs are normally inflated. No airspace consolidation, pleural
effusion, or pulmonary edema. No pneumothorax.

No acute osseous abnormality. Deformity and thickening noted about
the partially visualized proximal right humerus, indeterminate.
IMPRESSION: 1. No active cardiopulmonary disease.
2. Deformity and thickening about the partially visualized proximal
right humerus, indeterminate. Correlation with history and physical
exam recommended. Further assessment with dedicated radiograph could
be performed as clinically warranted.

## 2022-07-05 ENCOUNTER — Encounter (HOSPITAL_COMMUNITY): Payer: Self-pay | Admitting: Emergency Medicine

## 2022-07-05 ENCOUNTER — Other Ambulatory Visit: Payer: Self-pay

## 2022-07-05 ENCOUNTER — Emergency Department (HOSPITAL_COMMUNITY)
Admission: EM | Admit: 2022-07-05 | Discharge: 2022-07-05 | Disposition: A | Discharge status: Home / Self Care | Payer: BC Managed Care – PPO | Attending: Student | Admitting: Student

## 2022-07-05 DIAGNOSIS — R109 Unspecified abdominal pain: Secondary | ICD-10-CM | POA: Diagnosis present

## 2022-07-05 DIAGNOSIS — R197 Diarrhea, unspecified: Secondary | ICD-10-CM | POA: Insufficient documentation

## 2022-07-05 DIAGNOSIS — R1084 Generalized abdominal pain: Secondary | ICD-10-CM | POA: Diagnosis not present

## 2022-07-05 DIAGNOSIS — R112 Nausea with vomiting, unspecified: Secondary | ICD-10-CM | POA: Insufficient documentation

## 2022-07-05 DIAGNOSIS — R7401 Elevation of levels of liver transaminase levels: Secondary | ICD-10-CM | POA: Insufficient documentation

## 2022-07-05 LAB — CBC WITH DIFFERENTIAL/PLATELET
Abs Immature Granulocytes: 0.04 10*3/uL (ref 0.00–0.07)
Basophils Absolute: 0 10*3/uL (ref 0.0–0.1)
Basophils Relative: 0 %
Eosinophils Absolute: 0 10*3/uL (ref 0.0–0.5)
Eosinophils Relative: 0 %
HCT: 42.9 % (ref 39.0–52.0)
Hemoglobin: 15 g/dL (ref 13.0–17.0)
Immature Granulocytes: 0 %
Lymphocytes Relative: 6 %
Lymphs Abs: 0.6 10*3/uL — ABNORMAL LOW (ref 0.7–4.0)
MCH: 30.1 pg (ref 26.0–34.0)
MCHC: 35 g/dL (ref 30.0–36.0)
MCV: 86.1 fL (ref 80.0–100.0)
Monocytes Absolute: 0.6 10*3/uL (ref 0.1–1.0)
Monocytes Relative: 6 %
Neutro Abs: 9.1 10*3/uL — ABNORMAL HIGH (ref 1.7–7.7)
Neutrophils Relative %: 88 %
Platelets: 295 10*3/uL (ref 150–400)
RBC: 4.98 MIL/uL (ref 4.22–5.81)
RDW: 12.9 % (ref 11.5–15.5)
WBC: 10.5 10*3/uL (ref 4.0–10.5)
nRBC: 0 % (ref 0.0–0.2)

## 2022-07-05 LAB — COMPREHENSIVE METABOLIC PANEL
ALT: 62 U/L — ABNORMAL HIGH (ref 0–44)
AST: 36 U/L (ref 15–41)
Albumin: 4.5 g/dL (ref 3.5–5.0)
Alkaline Phosphatase: 72 U/L (ref 38–126)
Anion gap: 9 (ref 5–15)
BUN: 18 mg/dL (ref 6–20)
CO2: 23 mmol/L (ref 22–32)
Calcium: 9 mg/dL (ref 8.9–10.3)
Chloride: 106 mmol/L (ref 98–111)
Creatinine, Ser: 0.82 mg/dL (ref 0.61–1.24)
GFR, Estimated: >60 mL/min (ref >60)
Glucose, Bld: 106 mg/dL — ABNORMAL HIGH (ref 70–99)
Potassium: 3.7 mmol/L (ref 3.5–5.1)
Sodium: 138 mmol/L (ref 135–145)
Total Bilirubin: 1.3 mg/dL — ABNORMAL HIGH (ref 0.3–1.2)
Total Protein: 7.4 g/dL (ref 6.5–8.1)

## 2022-07-05 LAB — LIPASE, BLOOD: Lipase: 26 U/L (ref 11–51)

## 2022-07-05 MED ORDER — DICYCLOMINE HCL 20 MG PO TABS: 20.0000 mg | ORAL_TABLET | Freq: Two times a day (BID) | ORAL | 0 refills | Status: AC

## 2022-07-05 MED ORDER — SODIUM CHLORIDE 0.9 % IV BOLUS
1000.0000 mL | Freq: Once | INTRAVENOUS | Status: AC
  Administered 2022-07-05: 1000 mL via INTRAVENOUS

## 2022-07-05 MED ORDER — ONDANSETRON HCL 4 MG/2ML IJ SOLN
4.0000 mg | Freq: Once | INTRAMUSCULAR | Status: AC
  Administered 2022-07-05: 4 mg via INTRAVENOUS
  Filled 2022-07-05: qty 2

## 2022-07-05 MED ORDER — ONDANSETRON 4 MG PO TBDP: 4.0000 mg | ORAL_TABLET | Freq: Three times a day (TID) | ORAL | 0 refills | Status: AC | PRN

## 2022-07-05 NOTE — Discharge Instructions (Addendum)
All of your labs were reassuring today.  This could be related to something that you ate.  I have given you 2 prescriptions.  One for nausea and one for abdominal cramping.  Please take as prescribed.  I would stick to broths, rice, toast for a few days and slowly start to incorporate your normal diet.  Please return to the emergency department for any worsening symptoms.

## 2022-07-05 NOTE — ED Triage Notes (Signed)
Pt presents with abdominal pain with nausea and vomiting that started last night, had similar incident in April.

## 2022-07-05 NOTE — ED Provider Notes (Signed)
[facility] EMERGENCY DEPARTMENT Provider Note   CSN: [id number] Arrival date & time: 07/05/22  1130     History Chief Complaint  Patient presents with   Abdominal Pain    Neil BROADUS. is a 21 y.o. male patient who presents to the emerged from today with diffuse abdominal pain and intractable nausea and vomiting that started around 2 AM this morning.  Patient states he ate some food from [place] which he might be contributing to his symptoms.  He does endorse similar symptoms in the past with food.  Denies alcohol or illicit drugs.  Denies fever or chills.  No urinary complaints.  Also complaining of diarrhea.  No hematochezia or melena.  He does endorse mild hematemesis.   Abdominal Pain      Home Medications Prior to Admission medications   Medication Sig Start Date End Date Taking? Authorizing Provider  dicyclomine (BENTYL) 20 MG tablet Take 1 tablet (20 mg total) by mouth 2 (two) times daily. 07/05/22  Yes Meredeth Ide, Ngai Parcell M, PA-C  ondansetron (ZOFRAN-ODT) 4 MG disintegrating tablet Take 1 tablet (4 mg total) by mouth every 8 (eight) hours as needed for nausea or vomiting. 07/05/22  Yes Meredeth Ide, Camiya Vinal M, PA-C  albuterol (VENTOLIN HFA) 108 (90 Base) MCG/ACT inhaler Inhale 2 puffs into the lungs every 6 (six) hours as needed for wheezing or shortness of breath. 09/10/20   Shaune Pollack, MD  cloNIDine (CATAPRES) 0.2 MG tablet Take 0.2 mg by mouth once.    [provider]  dexmethylphenidate (FOCALIN XR) 20 MG 24 hr capsule Take 20 mg by mouth daily.    [provider]  dexmethylphenidate (FOCALIN) 5 MG tablet Take 5 mg by mouth 2 (two) times daily.    [provider]      Allergies    Oxcarbazepine and Tricyclic antidepressants    Review of Systems   Review of Systems  Gastrointestinal:  Positive for abdominal pain.  All other systems reviewed and are negative.   Physical Exam Updated Vital Signs BP 139/70 (BP Location: Left Arm)   Pulse  100   Temp 99 F (37.2 C) (Oral)   Resp (!) 117   Ht 5\' 7"  (1.702 m)   Wt 107.3 kg   SpO2 100%   BMI 37.06 kg/m  Physical Exam Vitals and nursing note reviewed.  Constitutional:      General: He is not in acute distress.    Appearance: Normal appearance.  HENT:     Head: Normocephalic and atraumatic.  Eyes:     General:        Right eye: No discharge.        Left eye: No discharge.  Cardiovascular:     Comments: Regular rate and rhythm.  S1/S2 are distinct without any evidence of murmur, rubs, or gallops.  Radial pulses are 2+ bilaterally.  Dorsalis pedis pulses are 2+ bilaterally.  No evidence of pedal edema. Pulmonary:     Comments: Clear to auscultation bilaterally.  Normal effort.  No respiratory distress.  No evidence of wheezes, rales, or rhonchi heard throughout. Abdominal:     General: Abdomen is flat. Bowel sounds are normal. There is no distension.     Tenderness: There is no abdominal tenderness. There is no guarding or rebound.  Musculoskeletal:        General: Normal range of motion.     Cervical back: Neck supple.  Skin:    General: Skin is warm and dry.  Findings: No rash.  Neurological:     General: No focal deficit present.     Mental Status: He is alert.  Psychiatric:        Mood and Affect: Mood normal.        Behavior: Behavior normal.     ED Results / Procedures / Treatments   Labs (all labs ordered are listed, but only abnormal results are displayed) Labs Reviewed  CBC WITH DIFFERENTIAL/PLATELET - Abnormal; Notable for the following components:      Result Value   Neutro Abs 9.1 (*)    Lymphs Abs 0.6 (*)    All other components within normal limits  COMPREHENSIVE METABOLIC PANEL - Abnormal; Notable for the following components:   Glucose, Bld 106 (*)    ALT 62 (*)    Total Bilirubin 1.3 (*)    All other components within normal limits  LIPASE, BLOOD    EKG None  Radiology No results found.  Procedures Procedures     Medications Ordered in ED Medications  sodium chloride 0.9 % bolus 1,000 mL (0 mLs Intravenous Stopped 07/05/22 1523)  ondansetron (ZOFRAN) injection 4 mg (4 mg Intravenous Given 07/05/22 1442)    ED Course/ Medical Decision Making/ A&P Clinical Course as of 07/05/22 1539  Wed Jul 05, 2022  1533 CBC with Differential(!) Normal. [CF]  1533 Comprehensive metabolic panel(!) ALT is elevated but seems to be at baseline in comparison to previous results.  Rest of his CMP is normal. [CF]  1533 Lipase, blood Negative. [CF]  [id number] On reevaluation, patient feeling somewhat better after liter of fluid and Zofran.  I discussed the findings of all labs.  Did share decision-making on CT abdomen pelvis.  I do not think that he needs one concern that his belly is nontender.  Patient agrees. [CF]    Clinical Course User Index [CF] Hendricks Limes, PA-C                           Medical Decision Making Neil Lane is a 21 y.o. male patient who presents to the emerged department today for further evaluation of diffuse abdominal pain and intractable nausea and vomiting.  Patient is nontender on my exam.  Given his symptoms of suspected viral gastroenteritis.  We will get basic labs, give him fluids, and Zofran.  I will plan to reassess.  We will also plan to p.o. challenge.  As highlighted in ED course, patient feeling somewhat better after fluids.  Patient has had no vomiting here or diarrhea and wishes to go home.  I think this is a reasonable plan.  I will prescribe him Zofran and Bentyl.  This is likely gastroenteritis.  Strict return precautions were discussed. He is safe for discharge.    Amount and/or Complexity of Data Reviewed Labs: ordered. Decision-making details documented in ED Course.  Risk Prescription drug management.   Final Clinical Impression(s) / ED Diagnoses Final diagnoses:  Generalized abdominal pain  Diarrhea, unspecified type  Nausea and vomiting, unspecified  vomiting type    Rx / DC Orders ED Discharge Orders          Ordered    ondansetron (ZOFRAN-ODT) 4 MG disintegrating tablet  Every 8 hours PRN        07/05/22 1534    dicyclomine (BENTYL) 20 MG tablet  2 times daily        07/05/22 1534  Honor Loh Timnath, PA-C 07/05/22 1539    Glendora Score, MD 07/05/22 [phone]

## 2022-11-28 ENCOUNTER — Emergency Department (HOSPITAL_COMMUNITY)
Admission: EM | Admit: 2022-11-28 | Discharge: 2022-11-28 | Discharge status: Against Medical Advice | Payer: BC Managed Care – PPO | Attending: Emergency Medicine | Admitting: Emergency Medicine

## 2022-11-28 ENCOUNTER — Other Ambulatory Visit: Payer: Self-pay

## 2022-11-28 DIAGNOSIS — Z5321 Procedure and treatment not carried out due to patient leaving prior to being seen by health care provider: Secondary | ICD-10-CM | POA: Diagnosis not present

## 2022-11-28 DIAGNOSIS — R197 Diarrhea, unspecified: Secondary | ICD-10-CM | POA: Insufficient documentation

## 2022-11-28 DIAGNOSIS — R1110 Vomiting, unspecified: Secondary | ICD-10-CM | POA: Insufficient documentation

## 2022-11-28 LAB — CBC
HCT: 45.1 % (ref 39.0–52.0)
Hemoglobin: 15.7 g/dL (ref 13.0–17.0)
MCH: 29.8 pg (ref 26.0–34.0)
MCHC: 34.8 g/dL (ref 30.0–36.0)
MCV: 85.6 fL (ref 80.0–100.0)
Platelets: 338 10*3/uL (ref 150–400)
RBC: 5.27 MIL/uL (ref 4.22–5.81)
RDW: 12.7 % (ref 11.5–15.5)
WBC: 12.9 10*3/uL — ABNORMAL HIGH (ref 4.0–10.5)
nRBC: 0 % (ref 0.0–0.2)

## 2022-11-28 LAB — COMPREHENSIVE METABOLIC PANEL
ALT: 50 U/L — ABNORMAL HIGH (ref 0–44)
AST: 38 U/L (ref 15–41)
Albumin: 4.6 g/dL (ref 3.5–5.0)
Alkaline Phosphatase: 91 U/L (ref 38–126)
Anion gap: 10 (ref 5–15)
BUN: 18 mg/dL (ref 6–20)
CO2: 23 mmol/L (ref 22–32)
Calcium: 9.2 mg/dL (ref 8.9–10.3)
Chloride: 104 mmol/L (ref 98–111)
Creatinine, Ser: 0.97 mg/dL (ref 0.61–1.24)
GFR, Estimated: >60 mL/min (ref >60)
Glucose, Bld: 111 mg/dL — ABNORMAL HIGH (ref 70–99)
Potassium: 3.5 mmol/L (ref 3.5–5.1)
Sodium: 137 mmol/L (ref 135–145)
Total Bilirubin: 0.6 mg/dL (ref 0.3–1.2)
Total Protein: 7.7 g/dL (ref 6.5–8.1)

## 2022-11-28 LAB — LIPASE, BLOOD: Lipase: 32 U/L (ref 11–51)

## 2022-11-28 MED ORDER — ONDANSETRON 4 MG PO TBDP: 4.0000 mg | ORAL_TABLET | Freq: Three times a day (TID) | ORAL | Status: DC | PRN

## 2022-11-28 NOTE — ED Triage Notes (Signed)
Pt presents with diffuse abdominal pain since this morning and one episode of vomiting/diarrhea.  Pt states he has not tried to eat or drink anything today.  States he "always gets really dehydrated when he gets sick so he just came here"

## 2022-11-28 NOTE — ED Provider Triage Note (Signed)
Emergency Medicine Provider Triage Evaluation Note  Neil Lane , a 22 y.o. male  was evaluated in triage.  Pt complains of vomiting, diarrhea.  Patient reports symptoms beginning upon awakening this morning with 1 episode of emesis.  He reports episodes of diarrhea occurring later on this evening.  Denies abdominal pain, hematemesis, hematochezia/melena.  Patient states that he is unaware of if he ate anything possibly exposing him to foodborne illness.  Denies any 1 close to him having similar symptoms.  Denies fever, chills, night sweats, chest pain, shortness of breath, urinary symptoms.  Review of Systems  Positive: See above Negative:   Physical Exam  BP (!) 126/58 (BP Location: Right Arm)   Pulse 70   Temp 98.4 F (36.9 C) (Oral)   Resp 18   SpO2 100%  Gen:   Awake, no distress   Resp:  Normal effort  MSK:   Moves extremities without difficulty  Other:  No abdominal tenderness  Medical Decision Making  Medically screening exam initiated at 7:32 PM.  Appropriate orders placed.  Patterson Hammersmith V. was informed that the remainder of the evaluation will be completed by another provider, this initial triage assessment does not replace that evaluation, and the importance of remaining in the ED until their evaluation is complete.     Wilnette Kales, [place] 11/28/22 [phone]

## 2022-11-28 NOTE — ED Notes (Signed)
Called for MSE with no answer x 1

## 2023-04-22 ENCOUNTER — Other Ambulatory Visit: Payer: Self-pay

## 2023-04-22 ENCOUNTER — Emergency Department (HOSPITAL_BASED_OUTPATIENT_CLINIC_OR_DEPARTMENT_OTHER)
Admission: EM | Admit: 2023-04-22 | Discharge: 2023-04-22 | Disposition: A | Discharge status: Home / Self Care | Payer: Worker's Compensation | Attending: Emergency Medicine | Admitting: Emergency Medicine

## 2023-04-22 ENCOUNTER — Encounter (HOSPITAL_BASED_OUTPATIENT_CLINIC_OR_DEPARTMENT_OTHER): Payer: Self-pay | Admitting: Emergency Medicine

## 2023-04-22 DIAGNOSIS — Z23 Encounter for immunization: Secondary | ICD-10-CM | POA: Diagnosis not present

## 2023-04-22 DIAGNOSIS — S61214A Laceration without foreign body of right ring finger without damage to nail, initial encounter: Secondary | ICD-10-CM | POA: Diagnosis present

## 2023-04-22 DIAGNOSIS — W25XXXA Contact with sharp glass, initial encounter: Secondary | ICD-10-CM | POA: Diagnosis not present

## 2023-04-22 DIAGNOSIS — Y990 Civilian activity done for income or pay: Secondary | ICD-10-CM | POA: Diagnosis not present

## 2023-04-22 MED ORDER — TETANUS-DIPHTH-ACELL PERTUSSIS 5-2.5-18.5 LF-MCG/0.5 IM SUSY
0.5000 mL | PREFILLED_SYRINGE | Freq: Once | INTRAMUSCULAR | Status: AC
  Administered 2023-04-22: 0.5 mL via INTRAMUSCULAR
  Filled 2023-04-22: qty 0.5

## 2023-04-22 MED ORDER — LIDOCAINE-EPINEPHRINE (PF) 2 %-1:200000 IJ SOLN
10.0000 mL | Freq: Once | INTRAMUSCULAR | Status: DC
  Filled 2023-04-22: qty 20

## 2023-04-22 MED ORDER — BACITRACIN ZINC 500 UNIT/GM EX OINT: TOPICAL_OINTMENT | Freq: Once | CUTANEOUS | Status: AC

## 2023-04-22 NOTE — ED Provider Notes (Signed)
[place] EMERGENCY DEPARTMENT AT [facility] Provider Note   CSN: [id number] Arrival date & time: 04/22/23  2051     History  Chief Complaint  Patient presents with   Finger Injury    Neil Lane is a 22 y.o. male.  22 yo M with a chief complaints of a finger laceration.  The patient tells me that he cut it while at work.  Not sure exactly what he did.  He is a Psychologist, occupational by trade.  Happened just prior to arrival.  His supervisor told him to come and get it fixed.  He is not sure if his tetanus is up-to-date but thinks that should be because he is a Psychologist, occupational.  He is not sure what exactly cut the finger.        Home Medications Prior to Admission medications   Medication Sig Start Date End Date Taking? Authorizing Provider  albuterol (VENTOLIN HFA) 108 (90 Base) MCG/ACT inhaler Inhale 2 puffs into the lungs every 6 (six) hours as needed for wheezing or shortness of breath. 09/10/20   Shaune Pollack, MD  cloNIDine (CATAPRES) 0.2 MG tablet Take 0.2 mg by mouth once.    [provider]  dexmethylphenidate (FOCALIN XR) 20 MG 24 hr capsule Take 20 mg by mouth daily.    [provider]  dexmethylphenidate (FOCALIN) 5 MG tablet Take 5 mg by mouth 2 (two) times daily.    [provider]  dicyclomine (BENTYL) 20 MG tablet Take 1 tablet (20 mg total) by mouth 2 (two) times daily. 07/05/22   Honor Loh M, PA-C  ondansetron (ZOFRAN-ODT) 4 MG disintegrating tablet Take 1 tablet (4 mg total) by mouth every 8 (eight) hours as needed for nausea or vomiting. 07/05/22   Teressa Lower, PA-C      Allergies    Oxcarbazepine and Tricyclic antidepressants    Review of Systems   Review of Systems  Physical Exam Updated Vital Signs BP (!) 152/85 (BP Location: Left Arm)   Pulse 67   Temp 98.1 F (36.7 C) (Oral)   Resp 20   SpO2 100%  Physical Exam Vitals and nursing note reviewed.  Constitutional:      Appearance: He is well-developed.  HENT:      Head: Normocephalic and atraumatic.  Eyes:     Pupils: Pupils are equal, round, and reactive to light.  Neck:     Vascular: No JVD.  Cardiovascular:     Rate and Rhythm: Normal rate and regular rhythm.     Heart sounds: No murmur heard.    No friction rub. No gallop.  Pulmonary:     Effort: No respiratory distress.     Breath sounds: No wheezing.  Abdominal:     General: There is no distension.     Tenderness: There is no abdominal tenderness. There is no guarding or rebound.  Musculoskeletal:        General: Normal range of motion.     Cervical back: Normal range of motion and neck supple.     Comments: Laceration through the palmar aspect of the right fourth digit.  Full range of motion of the finger.  No obvious foreign body.  Skin:    Coloration: Skin is not pale.     Findings: No rash.  Neurological:     Mental Status: He is alert and oriented to person, place, and time.  Psychiatric:        Behavior: Behavior normal.  ED Results / Procedures / Treatments   Labs (all labs ordered are listed, but only abnormal results are displayed) Labs Reviewed - No data to display  EKG None  Radiology No results found.  Procedures Procedures    Medications Ordered in ED Medications  lidocaine-EPINEPHrine (XYLOCAINE W/EPI) 2 %-1:200000 (PF) injection 10 mL (10 mLs Intradermal Not Given 04/22/23 2117)  bacitracin ointment (has no administration in time range)  Tdap (BOOSTRIX) injection 0.5 mL (has no administration in time range)    ED Course/ Medical Decision Making/ A&P                             Medical Decision Making Risk OTC drugs. Prescription drug management.   29 y oM with a chief complaints of a finger laceration.  This occurred just prior to arrival.  Is not sure what exactly cut it.  I was discussing repairing the wound at bedside and was drawing up lidocaine when he had to be stopped and told me he did not want it fixed.  He told me that he just wants  it wrapped up and he will follow-up with his doctor in the office.  He is unsure of his last tetanus will attempt updated here.  Discussed wound care with him at bedside.  Discussed rationale for suturing.  He continues to decline.  9:19 PM:  I have discussed the diagnosis/risks/treatment options with the patient.  Evaluation and diagnostic testing in the emergency department does not suggest an emergent condition requiring admission or immediate intervention beyond what has been performed at this time.  They will follow up with PCP. We also discussed returning to the ED immediately if new or worsening sx occur. We discussed the sx which are most concerning (e.g., sudden worsening pain, fever, inability to tolerate by mouth) that necessitate immediate return. Medications administered to the patient during their visit and any new prescriptions provided to the patient are listed below.  Medications given during this visit Medications  lidocaine-EPINEPHrine (XYLOCAINE W/EPI) 2 %-1:200000 (PF) injection 10 mL (10 mLs Intradermal Not Given 04/22/23 2117)  bacitracin ointment (has no administration in time range)  Tdap (BOOSTRIX) injection 0.5 mL (has no administration in time range)     The patient appears reasonably screen and/or stabilized for discharge and I doubt any other medical condition or other [facility] requiring further screening, evaluation, or treatment in the ED at this time prior to discharge.          Final Clinical Impression(s) / ED Diagnoses Final diagnoses:  Laceration of right ring finger without foreign body without damage to nail, initial encounter    Rx / DC Orders ED Discharge Orders     None         Melene Plan, DO 04/22/23 2119

## 2023-04-22 NOTE — Discharge Instructions (Signed)
Apply an ointment to it twice a day. Keep it dressed at work.   .  Return for redness drainage or fever.

## 2023-04-22 NOTE — ED Notes (Addendum)
Provider attempted to administer lidocaine by infiltration for lac repair, patient refused lac repair. Dressing applied by Rolly Salter, RN.

## 2023-04-22 NOTE — ED Triage Notes (Signed)
Pt here from work with a lac to his ring finger from he thinks it may have been a piece of glass

## 2023-04-22 NOTE — ED Notes (Signed)
Reviewed AVS with patient, patient expressed understanding of directions, denies further questions at this time. 

## 2023-08-12 ENCOUNTER — Emergency Department (HOSPITAL_COMMUNITY)
Admission: EM | Admit: 2023-08-12 | Discharge: 2023-08-12 | Disposition: A | Discharge status: Home / Self Care | Payer: Medicaid Other | Attending: Emergency Medicine | Admitting: Emergency Medicine

## 2023-08-12 ENCOUNTER — Encounter (HOSPITAL_COMMUNITY): Payer: Self-pay

## 2023-08-12 ENCOUNTER — Other Ambulatory Visit: Payer: Self-pay

## 2023-08-12 DIAGNOSIS — M546 Pain in thoracic spine: Secondary | ICD-10-CM | POA: Diagnosis not present

## 2023-08-12 DIAGNOSIS — I10 Essential (primary) hypertension: Secondary | ICD-10-CM | POA: Diagnosis not present

## 2023-08-12 DIAGNOSIS — X501XXA Overexertion from prolonged static or awkward postures, initial encounter: Secondary | ICD-10-CM | POA: Insufficient documentation

## 2023-08-12 DIAGNOSIS — M549 Dorsalgia, unspecified: Secondary | ICD-10-CM | POA: Diagnosis present

## 2023-08-12 MED ORDER — MELOXICAM 15 MG PO TABS: 15.0000 mg | ORAL_TABLET | Freq: Every day | ORAL | 0 refills | Status: AC

## 2023-08-12 MED ORDER — METHOCARBAMOL 500 MG PO TABS: 500.0000 mg | ORAL_TABLET | Freq: Two times a day (BID) | ORAL | 0 refills | Status: AC | PRN

## 2023-08-12 NOTE — Discharge Instructions (Signed)
I would like for you to follow-up with your family doctor for a recheck of your blood pressure as it was too high, it does not need medication today but if it stays high it may need medication in the future.  I want you to take the following medications for your back  Please take Mobic,  once daily as needed for pain - this in an antiinflammatory medicine (NSAID) and is similar to ibuprofen - many people feel that it is stronger than ibuprofen and it is easier to take since it is a smaller pill.  Please use this only for 1 week - if your pain persists, you will need to follow up with your doctor in the office for ongoing guidance and pain control.  Please take Robaxin, 500 mg up to 2 or 3 times a day as needed for muscle spasm, this is a muscle relaxer, it may cause generalized weakness, sleepiness and you should not drive or do important things while taking this medication.  This includes driving a vehicle or taking care of young children, these things should not be done while taking this medication.  If you develop worsening pain, weakness, numbness, fevers or any other severe worsening symptoms return to the emergency department immediately  [facility] Primary Care Doctor List    Syliva Overman, MD. Specialty: [facility] Medicine Contact information: [address]  [place] [address]  [phone]   Lilyan Punt, MD. Specialty: Family Medicine Contact information: [address]  [place] [address]  [phone]   Avon Gully, MD Specialty: Internal Medicine Contact information: [address] [place] [address]  [phone]   Catalina Pizza, MD. Specialty: Internal Medicine Contact information: [address]  [place] [address]  [phone]    [facility] (Dr. Selena Batten) Specialty: Family Medicine Contact information: [address]  [place] [address]  [phone]   John Giovanni, MD. Specialty: [facility] Medicine Contact information: [address]  [address]  [place] [address]  [phone]   Carylon Perches, MD. Specialty: Internal Medicine Contact information: [address]  [address]  [place] [address]  [phone]   [facility] Family Medicine: [address] [phone]  [facility], Family medicine [address]  [phone]  [facility] [address] [place], [place] [phone]  [facility]: [address] [phone]    [facility]  [address] [place], [place] [address] [phone]  Services [facility] offers a variety of basic health services.  Services include but are not limited to: Blood pressure checks  Heart rate checks  Blood sugar checks  Urine analysis  Rapid strep tests  Pregnancy tests.  Health education and referrals  People needing more complex services will be directed to a physician online. Using these virtual visits, doctors can evaluate and prescribe medicine and treatments. There will be no medication on-site, though [facility] will help patients fill their prescriptions at little to no cost.   For More information please go to: [email or web address]  Allergy and Asthma:    [address] [place] [phone]  Urology:  [address]  [place] [phone]  [facility]  [address] [place], [place] [phone]  Orthopedics   [address] [place], [place] [phone]  Endocrinology  [address] [place], [place] [phone]  Podiatry:  [facility] [phone]

## 2023-08-12 NOTE — ED Triage Notes (Addendum)
C/o middle back pain x1 day that is worse when bending.  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  Denies urinary/bowel incontinence.  Ibuprofen w/o relief

## 2023-08-12 NOTE — ED Provider Notes (Signed)
[place] EMERGENCY DEPARTMENT AT [facility] Provider Note   CSN: [id number] Arrival date & time: 08/12/23  1510     History  No chief complaint on file.   Neil Lane is a 22 y.o. male.  HPI   This patient is a 22 year old male, no chronic medical problems, denies any issues with his back, states that within the last couple of days he started having mild back pain, started to get worse yesterday evening, approximately 36 hours ago.  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  No high risk features including IV drug use or fevers  Home Medications Prior to Admission medications   Medication Sig Start Date End Date Taking? Authorizing Provider  meloxicam (MOBIC) 15 MG tablet Take 1 tablet (15 mg total) by mouth daily for 14 days. 08/12/23 08/26/23 Yes Eber Hong, MD  methocarbamol (ROBAXIN) 500 MG tablet Take 1 tablet (500 mg total) by mouth 2 (two) times daily as needed for muscle spasms. 08/12/23  Yes Eber Hong, MD  albuterol (VENTOLIN HFA) 108 (90 Base) MCG/ACT inhaler Inhale 2 puffs into the lungs every 6 (six) hours as needed for wheezing or shortness of breath. 09/10/20   Shaune Pollack, MD  cloNIDine (CATAPRES) 0.2 MG tablet Take 0.2 mg by mouth once.    [provider]  dexmethylphenidate (FOCALIN XR) 20 MG 24 hr capsule Take 20 mg by mouth daily.    [provider]  dexmethylphenidate (FOCALIN) 5 MG tablet Take 5 mg by mouth 2 (two) times daily.    [provider]  dicyclomine (BENTYL) 20 MG tablet Take 1 tablet (20 mg total) by mouth 2 (two) times daily. 07/05/22   Honor Loh M, PA-C  ondansetron (ZOFRAN-ODT) 4 MG disintegrating tablet Take 1 tablet (4 mg total) by mouth every 8 (eight) hours as needed for nausea or vomiting. 07/05/22    Teressa Lower, PA-C      Allergies    Oxcarbazepine and Tricyclic antidepressants    Review of Systems   Review of Systems  All other systems reviewed and are negative.   Physical Exam Updated Vital Signs BP (!) 158/78 (BP Location: Right Arm)   Pulse 65   Temp 98.6 F (37 C) (Oral)   Resp 17   Wt 104.3 kg   SpO2 98%   BMI 36.02 kg/m  Physical Exam Vitals and nursing note reviewed.  Constitutional:      General: He is not in acute distress.    Appearance: He is well-developed.  HENT:     Head: Normocephalic and atraumatic.     Mouth/Throat:     Pharynx: No oropharyngeal exudate.  Eyes:     General: No scleral icterus.       Right eye: No discharge.        Left eye: No discharge.     Conjunctiva/sclera: Conjunctivae normal.     Pupils: Pupils are equal, round, and reactive to light.  Neck:     Thyroid: No thyromegaly.     Vascular: No JVD.  Cardiovascular:     Rate and Rhythm: Normal rate and regular rhythm.     Heart sounds: Normal heart sounds. No murmur heard.    No friction rub. No gallop.  Pulmonary:  Effort: Pulmonary effort is normal. No respiratory distress.     Breath sounds: Normal breath sounds. No wheezing or rales.  Abdominal:     General: Bowel sounds are normal. There is no distension.     Palpations: Abdomen is soft. There is no mass.     Tenderness: There is no abdominal tenderness.  Musculoskeletal:        General: No tenderness. Normal range of motion.     Cervical back: Normal range of motion and neck supple.     Right lower leg: No edema.     Left lower leg: No edema.     Comments: There is no reproducible tenderness across the back  Lymphadenopathy:     Cervical: No cervical adenopathy.  Skin:    General: Skin is warm and dry.     Findings: No erythema or rash.  Neurological:     Mental Status: He is alert.     Coordination: Coordination normal.     Comments: Normal strength and sensation of the lower extremities, ambulatory  without difficulty, no antalgic gait  Psychiatric:        Behavior: Behavior normal.     ED Results / Procedures / Treatments   Labs (all labs ordered are listed, but only abnormal results are displayed) Labs Reviewed - No data to display  EKG None  Radiology No results found.  Procedures Procedures    Medications Ordered in ED Medications - No data to display  ED Course/ Medical Decision Making/ A&P                                 Medical Decision Making  Lungs are clear, heart is regular, vital signs reflect mild hypertension, he is not febrile he has no high risk features he has no neurologic symptoms he has no reproducible pain, he does a job where he does a lot of bending twisting and lifting suggesting that this is probably musculoskeletal.  In the absence of any radicular symptoms I doubt that there is a need for MRI or acute intervention at this time, he will be given return precautions and some supportive medications including an anti-inflammatory and a muscle relaxer,  The patient is agreeable to the plan        Final Clinical Impression(s) / ED Diagnoses Final diagnoses:  Acute thoracic back pain, unspecified back pain laterality  Essential hypertension    Rx / DC Orders ED Discharge Orders          Ordered    meloxicam (MOBIC) 15 MG tablet  Daily        08/12/23 1541    methocarbamol (ROBAXIN) 500 MG tablet  2 times daily PRN        08/12/23 1541              Eber Hong, MD 08/12/23 1541

## 2023-10-03 ENCOUNTER — Ambulatory Visit: Payer: Self-pay | Admitting: General Practice

## 2023-10-03 NOTE — Telephone Encounter (Signed)
  Chief Complaint: cough Symptoms: cough, congestion Pertinent Negatives: Patient denies fever, SOB Disposition: [] ED /[x] Urgent Care (no appt availability in office) / [] Appointment(In office/virtual)/ []  [facility]/ [] Home Care/ [] Refused Recommended Disposition /[] [facility]/ []  Follow-up with PCP Additional Notes: Patient called in reporting that he has been experiencing cough and congestion x3 weeks that has not been relieved with OTC medications. Patient denies fever and SOB. Attempted to schedule appointment within 3 days per protocol with [facility] per patient request and no appointments available. Patient scheduled with UC appointment 11/14.   Copied from CRM [id number]. Topic: Clinical - Pink Word Triage >> Oct 03, 2023 11:18 AM Raven B wrote: Reason for Triage: PT has severe cold & congestion, requested to speak with nurse Reason for Disposition  Cough has been present for > 3 weeks  Answer Assessment - Initial Assessment Questions 1. ONSET: "When did the cough begin?"      "A few weeks ago" 2. SEVERITY: "How bad is the cough today?"      severe 3. SPUTUM: "Describe the color of your sputum" (none, dry cough; clear, white, yellow, green)     yellow 4. HEMOPTYSIS: "Are you coughing up any blood?" If so ask: "How much?" (flecks, streaks, tablespoons, etc.)     no 5. DIFFICULTY BREATHING: "Are you having difficulty breathing?" If Yes, ask: "How bad is it?" (e.g., mild, moderate, severe)    - MILD: No SOB at rest, mild SOB with walking, speaks normally in sentences, can lie down, no retractions, pulse < 100.    - MODERATE: SOB at rest, SOB with minimal exertion and prefers to sit, cannot lie down flat, speaks in phrases, mild retractions, audible wheezing, pulse 100-120.    - SEVERE: Very SOB at rest, speaks in single words, struggling to breathe, sitting hunched forward, retractions, pulse > 120      mild 6. FEVER: "Do you have a fever?" If Yes, ask:  "What is your temperature, how was it measured, and when did it start?"     no 7. CARDIAC HISTORY: "Do you have any history of heart disease?" (e.g., heart attack, congestive heart failure)      no 8. LUNG HISTORY: "Do you have any history of lung disease?"  (e.g., pulmonary embolus, asthma, emphysema)     no 9. PE RISK FACTORS: "Do you have a history of blood clots?" (or: recent major surgery, recent prolonged travel, bedridden)     no 10. OTHER SYMPTOMS: "Do you have any other symptoms?" (e.g., runny nose, wheezing, chest pain)       Congestion, cough, and occasional wheezing  Protocols used: Cough - Acute Non-Productive-A-AH

## 2023-10-04 ENCOUNTER — Ambulatory Visit
Admission: RE | Admit: 2023-10-04 | Discharge: 2023-10-04 | Disposition: A | Discharge status: Home / Self Care | Payer: BC Managed Care – PPO | Source: Ambulatory Visit | Attending: Nurse Practitioner | Admitting: Nurse Practitioner

## 2023-10-04 VITALS — BP 117/79 | HR 61 | Temp 97.9°F | Resp 18

## 2023-10-04 DIAGNOSIS — G9331 Postviral fatigue syndrome: Secondary | ICD-10-CM

## 2023-10-04 MED ORDER — BENZONATATE 100 MG PO CAPS: 100.0000 mg | ORAL_CAPSULE | Freq: Three times a day (TID) | ORAL | 0 refills | Status: AC | PRN

## 2023-10-04 NOTE — ED Triage Notes (Signed)
Pt reports he has a cough and nasal congestion, and mucus x 2 weeks

## 2023-10-04 NOTE — ED Provider Notes (Signed)
[facility]    CSN: [id number] Arrival date & time: 10/04/23  7829      History   Chief Complaint Chief Complaint  Patient presents with   Cough    cough x 3 weeks not improved with OTC. no PCP - Entered by patient    HPI Neil Lane is a 22 y.o. male.   Patient presents today with 3-week history of cough, runny and stuffy nose, sinus drainage.  He also endorses fever when symptoms for started, but this is now resolved.  No shortness of breath, chest pain, headache, ear pain, sore throat, abdominal pain, nausea/vomiting, or diarrhea.  He is eating and drinking normally and energy levels are stable for him.  Has taken over-the-counter cough medicines which help temporarily.    Past Medical History:  Diagnosis Date   ADHD (attention deficit hyperactivity disorder), combined type     There are no problems to display for this patient.   Past Surgical History:  Procedure Laterality Date   FRACTURE SURGERY         Home Medications    Prior to Admission medications   Medication Sig Start Date End Date Taking? Authorizing Provider  benzonatate (TESSALON) 100 MG capsule Take 1 capsule (100 mg total) by mouth 3 (three) times daily as needed for cough. Do not take with alcohol or while driving or operating heavy machinery.  May cause drowsiness. 10/04/23  Yes Valentino Nose, NP  albuterol (VENTOLIN HFA) 108 (90 Base) MCG/ACT inhaler Inhale 2 puffs into the lungs every 6 (six) hours as needed for wheezing or shortness of breath. 09/10/20   Shaune Pollack, MD  cloNIDine (CATAPRES) 0.2 MG tablet Take 0.2 mg by mouth once.    [provider]  dexmethylphenidate (FOCALIN XR) 20 MG 24 hr capsule Take 20 mg by mouth daily.    [provider]  dexmethylphenidate (FOCALIN) 5 MG tablet Take 5 mg by mouth 2 (two) times daily.    [provider]  dicyclomine (BENTYL) 20 MG tablet Take 1 tablet (20 mg total) by mouth 2 (two) times daily.  07/05/22   Honor Loh M, PA-C  methocarbamol (ROBAXIN) 500 MG tablet Take 1 tablet (500 mg total) by mouth 2 (two) times daily as needed for muscle spasms. 08/12/23   Eber Hong, MD  ondansetron (ZOFRAN-ODT) 4 MG disintegrating tablet Take 1 tablet (4 mg total) by mouth every 8 (eight) hours as needed for nausea or vomiting. 07/05/22   Teressa Lower, PA-C    Family History History reviewed. No pertinent family history.  Social History Social History   Tobacco Use   Smoking status: Never   Smokeless tobacco: Never  Vaping Use   Vaping status: Never Used  Substance Use Topics   Alcohol use: No   Drug use: No     Allergies   Oxcarbazepine and Tricyclic antidepressants   Review of Systems Review of Systems Per HPI  Physical Exam Triage Vital Signs ED Triage Vitals  Encounter Vitals Group     BP 10/04/23 0903 117/79     Systolic BP Percentile --      Diastolic BP Percentile --      Pulse Rate 10/04/23 0903 61     Resp 10/04/23 0903 18     Temp 10/04/23 0903 97.9 F (36.6 C)     Temp Source 10/04/23 0903 Oral     SpO2 10/04/23 0903 96 %     Weight --  Height --      Head Circumference --      Peak Flow --      Pain Score 10/04/23 0904 0     Pain Loc --      Pain Education --      Exclude from Growth Chart --    No data found.  Updated Vital Signs BP 117/79   Pulse 61   Temp 97.9 F (36.6 C) (Oral)   Resp 18   SpO2 96%   Visual Acuity Right Eye Distance:   Left Eye Distance:   Bilateral Distance:    Right Eye Near:   Left Eye Near:    Bilateral Near:     Physical Exam Vitals and nursing note reviewed.  Constitutional:      General: He is not in acute distress.    Appearance: Normal appearance. He is not ill-appearing or toxic-appearing.  HENT:     Head: Normocephalic and atraumatic.     Right Ear: Tympanic membrane, ear canal and external ear normal.     Left Ear: Tympanic membrane, ear canal and external ear normal.     Nose: No  congestion or rhinorrhea.     Mouth/Throat:     Mouth: Mucous membranes are moist.     Pharynx: Oropharynx is clear. No oropharyngeal exudate or posterior oropharyngeal erythema.  Eyes:     General: No scleral icterus.    Extraocular Movements: Extraocular movements intact.  Cardiovascular:     Rate and Rhythm: Normal rate and regular rhythm.  Pulmonary:     Effort: Pulmonary effort is normal. No respiratory distress.     Breath sounds: Normal breath sounds. No wheezing, rhonchi or rales.  Musculoskeletal:     Cervical back: Normal range of motion and neck supple.  Lymphadenopathy:     Cervical: No cervical adenopathy.  Skin:    General: Skin is warm and dry.     Coloration: Skin is not jaundiced or pale.     Findings: No erythema or rash.  Neurological:     Mental Status: He is alert and oriented to person, place, and time.  Psychiatric:        Behavior: Behavior is cooperative.      UC Treatments / Results  Labs (all labs ordered are listed, but only abnormal results are displayed) Labs Reviewed - No data to display  EKG   Radiology No results found.  Procedures Procedures (including critical care time)  Medications Ordered in UC Medications - No data to display  Initial Impression / Assessment and Plan / UC Course  I have reviewed the triage vital signs and the nursing notes.  Pertinent labs & imaging results that were available during my care of the patient were reviewed by me and considered in my medical decision making (see chart for details).   Patient is well-appearing, normotensive, afebrile, not tachycardic, not tachypneic, oxygenating well on room air.    1. Post viral syndrome Suspect ongoing symptoms from viral infection No red flags in history or on exam; vitals are stable Lungs are clear to auscultation bilaterally; low suspicion for pneumonia Supportive care discussed, start cough suppressant medication ER and return precautions discussed Work  excuse provided  The patient was given the opportunity to ask questions.  All questions answered to their satisfaction.  The patient is in agreement to this plan.    Final Clinical Impressions(s) / UC Diagnoses   Final diagnoses:  Post viral syndrome     Discharge Instructions  Your symptoms are likely due to an ongoing viral illness.  The cough should improve in the next couple of weeks.  The sinus congestion should also improve as well.  Some things that can make you feel better are: - Increased rest - Increasing fluid with water/sugar free electrolytes - Acetaminophen and ibuprofen as needed for fever/pain - Salt water gargling, chloraseptic spray and throat lozenges - OTC guaifenesin (Mucinex) 600 mg twice daily for congestion - Saline sinus flushes or a neti pot - Humidifying the air -Tessalon Perles every 8 hours as needed for dry cough      ED Prescriptions     Medication Sig Dispense Auth. Provider   benzonatate (TESSALON) 100 MG capsule Take 1 capsule (100 mg total) by mouth 3 (three) times daily as needed for cough. Do not take with alcohol or while driving or operating heavy machinery.  May cause drowsiness. 21 capsule Valentino Nose, NP      PDMP not reviewed this encounter.   Valentino Nose, NP 10/04/23 [phone]

## 2023-10-04 NOTE — Discharge Instructions (Addendum)
Your symptoms are likely due to an ongoing viral illness.  The cough should improve in the next couple of weeks.  The sinus congestion should also improve as well.  Some things that can make you feel better are: - Increased rest - Increasing fluid with water/sugar free electrolytes - Acetaminophen and ibuprofen as needed for fever/pain - Salt water gargling, chloraseptic spray and throat lozenges - OTC guaifenesin (Mucinex) 600 mg twice daily for congestion - Saline sinus flushes or a neti pot - Humidifying the air -Tessalon Perles every 8 hours as needed for dry cough

## 2023-11-29 ENCOUNTER — Encounter (HOSPITAL_COMMUNITY): Payer: Self-pay | Admitting: Emergency Medicine

## 2023-11-29 ENCOUNTER — Emergency Department (HOSPITAL_COMMUNITY)
Admission: EM | Admit: 2023-11-29 | Discharge: 2023-11-30 | Disposition: A | Discharge status: Home / Self Care | Payer: Self-pay | Attending: Emergency Medicine | Admitting: Emergency Medicine

## 2023-11-29 ENCOUNTER — Other Ambulatory Visit: Payer: Self-pay

## 2023-11-29 DIAGNOSIS — H66012 Acute suppurative otitis media with spontaneous rupture of ear drum, left ear: Secondary | ICD-10-CM | POA: Insufficient documentation

## 2023-11-29 MED ORDER — AMOXICILLIN-POT CLAVULANATE 875-125 MG PO TABS: 1.0000 | ORAL_TABLET | Freq: Two times a day (BID) | ORAL | 0 refills | Status: DC

## 2023-11-29 NOTE — Discharge Instructions (Addendum)
 You were seen for your ear infection (otitis media) in the emergency department.  Your tympanic membrane (eardrum) has ruptured.  At home, please take the antibiotics we have prescribed you.  Your eardrum will repair with time.    Check your MyChart online for the results of any tests that had not resulted by the time you left the emergency department.   Follow-up with ENT in 1 to 2 weeks.  Return immediately to the emergency department if you experience any of the following: Fevers, neck stiffness, severe headache, double or blurry vision, or any other concerning symptoms.    Thank you for visiting our Emergency Department. It was a pleasure taking care of you today.

## 2023-11-29 NOTE — ED Triage Notes (Signed)
 Pt in with L ear pain and decreased hearing since yesterday. Denies any fevers

## 2023-11-30 NOTE — ED Notes (Signed)
Patient verbalizes understanding of discharge instructions. Opportunity for questioning and answers were provided. Armband removed by staff, pt discharged from ED. Ambulated out to lobby  

## 2023-11-30 NOTE — ED Provider Notes (Signed)
 [place] EMERGENCY DEPARTMENT AT [facility] Provider Note   CSN: [id number] Arrival date & time: 11/29/23  1935     History  Chief Complaint  Patient presents with   Otalgia    Alm LELON Bennet LULLA. is a 23 y.o. male.  23 year old male who presents emergency department with ear pain and decreased hearing.  Several days ago started having mucus and congestion that he is blowing out of his nose.  Started developing left ear pain as well.  Says that yesterday when he woke up he could not hear anything out of his left ear.  Sounds like static coming out of his ear.  Says that he is also had a significant amount of drainage from his left ear canal.       Home Medications Prior to Admission medications   Medication Sig Start Date End Date Taking? Authorizing Provider  albuterol  (VENTOLIN  HFA) 108 (90 Base) MCG/ACT inhaler Inhale 2 puffs into the lungs every 6 (six) hours as needed for wheezing or shortness of breath. 09/10/20   Angelena Smalls, MD  amoxicillin -clavulanate (AUGMENTIN ) 875-125 MG tablet Take 1 tablet by mouth every 12 (twelve) hours. 11/29/23   Yolande Lamar BROCKS, MD  benzonatate  (TESSALON ) 100 MG capsule Take 1 capsule (100 mg total) by mouth 3 (three) times daily as needed for cough. Do not take with alcohol or while driving or operating heavy machinery.  May cause drowsiness. 10/04/23   Chandra Harlene LABOR, NP  cloNIDine (CATAPRES) 0.2 MG tablet Take 0.2 mg by mouth once.    [provider]  dexmethylphenidate (FOCALIN XR) 20 MG 24 hr capsule Take 20 mg by mouth daily.    [provider]  dexmethylphenidate (FOCALIN) 5 MG tablet Take 5 mg by mouth 2 (two) times daily.    [provider]  dicyclomine  (BENTYL ) 20 MG tablet Take 1 tablet (20 mg total) by mouth 2 (two) times daily. 07/05/22   Theotis Peers M, PA-C  methocarbamol  (ROBAXIN ) 500 MG tablet Take 1 tablet (500 mg total) by mouth 2 (two) times daily as needed for muscle spasms.  08/12/23   Cleotilde Rogue, MD  ondansetron  (ZOFRAN -ODT) 4 MG disintegrating tablet Take 1 tablet (4 mg total) by mouth every 8 (eight) hours as needed for nausea or vomiting. 07/05/22   Theotis Peers HERO, PA-C      Allergies    Oxcarbazepine and Tricyclic antidepressants    Review of Systems   Review of Systems  Physical Exam Updated Vital Signs BP (!) 134/56   Pulse 96   Temp 99.1 F (37.3 C) (Oral)   Resp 18   Wt 104.3 kg   SpO2 99%   BMI 36.01 kg/m  Physical Exam Vitals and nursing note reviewed.  Constitutional:      General: He is not in acute distress.    Appearance: He is well-developed.  HENT:     Head: Normocephalic and atraumatic.     Right Ear: Tympanic membrane, ear canal and external ear normal.     Left Ear: External ear normal.     Ears:     Comments: Left tympanic membrane ruptured with purulent material draining.  No rotation of the ear or mastoid tenderness or erythema.    Nose: Nose normal.  Eyes:     Extraocular Movements: Extraocular movements intact.     Conjunctiva/sclera: Conjunctivae normal.     Pupils: Pupils are equal, round, and reactive to light.  Pulmonary:     Effort: Pulmonary effort  is normal. No respiratory distress.  Musculoskeletal:     Cervical back: Normal range of motion and neck supple.  Neurological:     Mental Status: He is alert.     ED Results / Procedures / Treatments   Labs (all labs ordered are listed, but only abnormal results are displayed) Labs Reviewed - No data to display  EKG None  Radiology No results found.  Procedures Procedures    Medications Ordered in ED Medications - No data to display  ED Course/ Medical Decision Making/ A&P                                 Medical Decision Making Risk Prescription drug management.   Alm LELON Bennet ALONSO is a 23 y.o. male who presents to the emergency department with ear pain, decreased hearing, and drainage from his ear  Initial Ddx:  Otitis media,  chronic suppurative otitis media, perforated TM, mastoiditis, malignant otitis  MDM/Course:  Patient presents to the emergency department with URI symptoms that progressed into what I suspect was acute otitis media.  On exam he does appear to have a perforated TM which would explain his loss of hearing.  Started him on oral antibiotics will have him follow-up with ENT.  No other concerning features on his exam right now or complaints that would be worrisome cranial nerve deficits, malignant otitis externa, or mastoiditis.    This patient presents to the ED for concern of complaints listed in HPI, this involves an extensive number of treatment options, and is a complaint that carries with it a high risk of complications and morbidity. Disposition including potential need for admission considered.   Dispo: DC Home. Return precautions discussed including, but not limited to, those listed in the AVS. Allowed pt time to ask questions which were answered fully prior to dc.  Records reviewed Outpatient Clinic Notes I have reviewed the patients home medications and made adjustments as needed  Portions of this note were generated with Dragon dictation software. Dictation errors may occur despite best attempts at proofreading.     Final Clinical Impression(s) / ED Diagnoses Final diagnoses:  Acute suppurative otitis media of left ear with spontaneous rupture of tympanic membrane, recurrence not specified    Rx / DC Orders ED Discharge Orders          Ordered    amoxicillin -clavulanate (AUGMENTIN ) 875-125 MG tablet  Every 12 hours,   Status:  Discontinued        11/29/23 2359    amoxicillin -clavulanate (AUGMENTIN ) 875-125 MG tablet  Every 12 hours        11/29/23 2359              Yolande Lamar BROCKS, MD 12/04/23 1144

## 2024-02-20 ENCOUNTER — Ambulatory Visit
Admission: RE | Admit: 2024-02-20 | Discharge: 2024-02-20 | Disposition: A | Discharge status: Home / Self Care | Payer: Self-pay | Source: Ambulatory Visit | Attending: Nurse Practitioner | Admitting: Nurse Practitioner

## 2024-02-20 ENCOUNTER — Other Ambulatory Visit: Payer: Self-pay

## 2024-02-20 VITALS — BP 132/85 | HR 57 | Temp 97.9°F | Resp 20

## 2024-02-20 DIAGNOSIS — S91332A Puncture wound without foreign body, left foot, initial encounter: Secondary | ICD-10-CM

## 2024-02-20 DIAGNOSIS — M79672 Pain in left foot: Secondary | ICD-10-CM

## 2024-02-20 MED ORDER — AMOXICILLIN-POT CLAVULANATE 875-125 MG PO TABS: 1.0000 | ORAL_TABLET | Freq: Two times a day (BID) | ORAL | 0 refills | Status: AC

## 2024-02-20 NOTE — ED Provider Notes (Signed)
 [facility]    CSN: [id number] Arrival date & time: 02/20/24  1306      History   Chief Complaint Chief Complaint  Patient presents with   Foot Pain    HPI Neil Lane. is a 23 y.o. male.   The history is provided by the patient.   Patient presents for complaints of a puncture wound to the bottom of the left foot.  Patient states that he stepped on a board with a rusty nail.  States the nail measured approximately 1 to 2 inches.  States the nail did go through his shoe.  He states he now has pain with ambulation.  Denies fever, chills, foul-smelling drainage, oozing, or drainage.  Patient states his last tetanus shot was in 2024.  Past Medical History:  Diagnosis Date   ADHD (attention deficit hyperactivity disorder), combined type     There are no active problems to display for this patient.   Past Surgical History:  Procedure Laterality Date   FRACTURE SURGERY         Home Medications    Prior to Admission medications   Medication Sig Start Date End Date Taking? Authorizing Provider  albuterol (VENTOLIN HFA) 108 (90 Base) MCG/ACT inhaler Inhale 2 puffs into the lungs every 6 (six) hours as needed for wheezing or shortness of breath. 09/10/20   Shaune Pollack, MD  amoxicillin-clavulanate (AUGMENTIN) 875-125 MG tablet Take 1 tablet by mouth every 12 (twelve) hours. 11/29/23   Rondel Baton, MD  benzonatate (TESSALON) 100 MG capsule Take 1 capsule (100 mg total) by mouth 3 (three) times daily as needed for cough. Do not take with alcohol or while driving or operating heavy machinery.  May cause drowsiness. 10/04/23   Valentino Nose, NP  cloNIDine (CATAPRES) 0.2 MG tablet Take 0.2 mg by mouth once.    [provider]  dexmethylphenidate (FOCALIN XR) 20 MG 24 hr capsule Take 20 mg by mouth daily.    [provider]  dexmethylphenidate (FOCALIN) 5 MG tablet Take 5 mg by mouth 2 (two) times daily.    [provider]   dicyclomine (BENTYL) 20 MG tablet Take 1 tablet (20 mg total) by mouth 2 (two) times daily. 07/05/22   Honor Loh M, PA-C  methocarbamol (ROBAXIN) 500 MG tablet Take 1 tablet (500 mg total) by mouth 2 (two) times daily as needed for muscle spasms. 08/12/23   Eber Hong, MD  ondansetron (ZOFRAN-ODT) 4 MG disintegrating tablet Take 1 tablet (4 mg total) by mouth every 8 (eight) hours as needed for nausea or vomiting. 07/05/22   Teressa Lower, PA-C    Family History History reviewed. No pertinent family history.  Social History Social History   Tobacco Use   Smoking status: Never   Smokeless tobacco: Never  Vaping Use   Vaping status: Never Used  Substance Use Topics   Alcohol use: No   Drug use: No     Allergies   Oxcarbazepine and Tricyclic antidepressants   Review of Systems Review of Systems Per HPI  Physical Exam Triage Vital Signs ED Triage Vitals  Encounter Vitals Group     BP 02/20/24 1406 132/85     Systolic BP Percentile --      Diastolic BP Percentile --      Pulse Rate 02/20/24 1406 (!) 57     Resp 02/20/24 1406 20     Temp 02/20/24 1406 97.9 F (36.6 C)     Temp Source  02/20/24 1406 Oral     SpO2 02/20/24 1406 98 %     Weight --      Height --      Head Circumference --      Peak Flow --      Pain Score 02/20/24 1404 0     Pain Loc --      Pain Education --      Exclude from Growth Chart --    No data found.  Updated Vital Signs BP 132/85 (BP Location: Right Arm)   Pulse (!) 57   Temp 97.9 F (36.6 C) (Oral)   Resp 20   SpO2 98%   Visual Acuity Right Eye Distance:   Left Eye Distance:   Bilateral Distance:    Right Eye Near:   Left Eye Near:    Bilateral Near:     Physical Exam Vitals and nursing note reviewed.  Constitutional:      General: He is not in acute distress.    Appearance: Normal appearance.  HENT:     Head: Normocephalic.  Eyes:     Extraocular Movements: Extraocular movements intact.     Pupils: Pupils  are equal, round, and reactive to light.  Pulmonary:     Effort: Pulmonary effort is normal.  Musculoskeletal:     Cervical back: Normal range of motion.       Feet:  Skin:    General: Skin is warm and dry.     Findings: Wound present.     Comments: Puncture wound to sole of left foot.   Neurological:     General: No focal deficit present.     Mental Status: He is alert and oriented to person, place, and time.  Psychiatric:        Mood and Affect: Mood normal.        Behavior: Behavior normal.      UC Treatments / Results  Labs (all labs ordered are listed, but only abnormal results are displayed) Labs Reviewed - No data to display  EKG   Radiology No results found.  Procedures Procedures (including critical care time)  Medications Ordered in UC Medications - No data to display  Initial Impression / Assessment and Plan / UC Course  I have reviewed the triage vital signs and the nursing notes.  Pertinent labs & imaging results that were available during my care of the patient were reviewed by me and considered in my medical decision making (see chart for details).  Puncture wound to sole of left foot.  Patient states that he stepped on a rusty nail.  Last Tdap was in 2024.  Will start Augmentin 875/125mg  twice daily x 7 days for infection prophylaxis.  Supportive care recommendations were provided and discussed with the patient to include over-the-counter analgesics, soaking the foot in Epsom salt, and keeping the area clean and dry.  Patient also advised to wear shoes that are comfortable with good insole and support.  Discussed indications regarding follow-up.  Patient was in agreement with this plan of care and verbalized understanding.  All questions were answered.  Patient stable for discharge.   Final Clinical Impressions(s) / UC Diagnoses   Final diagnoses:  None   Discharge Instructions   None    ED Prescriptions   None    PDMP not reviewed this  encounter.   Abran Cantor, NP 02/20/24 1441

## 2024-02-20 NOTE — ED Triage Notes (Addendum)
 Pt reports left foot pain since stepping on a board with a nail in it yesterday. Pt reports nail went through bottom of left foot and states went to pull board and nail came out of skin. Small puncture site noted to bottom of left foot.   Last tetanus last year.

## 2024-02-20 NOTE — Discharge Instructions (Signed)
 Take medication as prescribed. Your last Tdap was in 2024. May take over-the-counter Tylenol or ibuprofen as needed for pain or discomfort. Make sure you are wearing shoes with good insole and support. Recommend warm Epsom salt soaks as needed to help with pain or swelling in the foot. Recommend freezing a water bottle and rolling it under the left foot to help with pain or discomfort. Seek care immediately if you experience increased redness, swelling, or foul-smelling drainage from the puncture wound on your foot. Follow-up as needed.

## 2024-02-25 ENCOUNTER — Ambulatory Visit
Admission: RE | Admit: 2024-02-25 | Discharge: 2024-02-25 | Disposition: A | Discharge status: Home / Self Care | Payer: Self-pay | Source: Ambulatory Visit

## 2024-02-25 VITALS — BP 145/78 | HR 63 | Temp 98.5°F | Resp 18

## 2024-02-25 DIAGNOSIS — S91332D Puncture wound without foreign body, left foot, subsequent encounter: Secondary | ICD-10-CM

## 2024-02-25 NOTE — ED Provider Notes (Signed)
 [facility]    CSN: [id number] Arrival date & time: 02/25/24  1343      History   Chief Complaint Chief Complaint  Patient presents with   Letter for School/Work    HPI Neil Lane. is a 23 y.o. male.   HPI  He was seen on 02/20/2024 for a puncture wound to his right foot and was prescribed Augmentin for 7 days.  He reports that he has been out of work for several days and is in today to have a return to work note.  He endorses that he did not take the antibiotic as directed. Past Medical History:  Diagnosis Date   ADHD (attention deficit hyperactivity disorder), combined type     There are no active problems to display for this patient.   Past Surgical History:  Procedure Laterality Date   FRACTURE SURGERY         Home Medications    Prior to Admission medications   Medication Sig Start Date End Date Taking? Authorizing Provider  albuterol (VENTOLIN HFA) 108 (90 Base) MCG/ACT inhaler Inhale 2 puffs into the lungs every 6 (six) hours as needed for wheezing or shortness of breath. 09/10/20   Shaune Pollack, MD  amoxicillin-clavulanate (AUGMENTIN) 875-125 MG tablet Take 1 tablet by mouth every 12 (twelve) hours. 02/20/24   Leath-Warren, Sadie Haber, NP  benzonatate (TESSALON) 100 MG capsule Take 1 capsule (100 mg total) by mouth 3 (three) times daily as needed for cough. Do not take with alcohol or while driving or operating heavy machinery.  May cause drowsiness. 10/04/23   Valentino Nose, NP  cloNIDine (CATAPRES) 0.2 MG tablet Take 0.2 mg by mouth once.    [provider]  dexmethylphenidate (FOCALIN XR) 20 MG 24 hr capsule Take 20 mg by mouth daily.    [provider]  dexmethylphenidate (FOCALIN) 5 MG tablet Take 5 mg by mouth 2 (two) times daily.    [provider]  dicyclomine (BENTYL) 20 MG tablet Take 1 tablet (20 mg total) by mouth 2 (two) times daily. 07/05/22   Honor Loh M, PA-C  methocarbamol (ROBAXIN) 500 MG  tablet Take 1 tablet (500 mg total) by mouth 2 (two) times daily as needed for muscle spasms. 08/12/23   Eber Hong, MD  ondansetron (ZOFRAN-ODT) 4 MG disintegrating tablet Take 1 tablet (4 mg total) by mouth every 8 (eight) hours as needed for nausea or vomiting. 07/05/22   Teressa Lower, PA-C    Family History History reviewed. No pertinent family history.  Social History Social History   Tobacco Use   Smoking status: Never   Smokeless tobacco: Never  Vaping Use   Vaping status: Some Days  Substance Use Topics   Alcohol use: No   Drug use: No     Allergies   Oxcarbazepine and Tricyclic antidepressants   Review of Systems Review of Systems   Physical Exam Triage Vital Signs ED Triage Vitals  Encounter Vitals Group     BP 02/25/24 1416 (!) 145/78     Systolic BP Percentile --      Diastolic BP Percentile --      Pulse Rate 02/25/24 1416 63     Resp 02/25/24 1416 18     Temp 02/25/24 1416 98.5 F (36.9 C)     Temp Source 02/25/24 1416 Oral     SpO2 02/25/24 1416 97 %     Weight --      Height --  Head Circumference --      Peak Flow --      Pain Score 02/25/24 1417 0     Pain Loc --      Pain Education --      Exclude from Growth Chart --    No data found.  Updated Vital Signs BP (!) 145/78 (BP Location: Right Arm)   Pulse 63   Temp 98.5 F (36.9 C) (Oral)   Resp 18   SpO2 97%   Visual Acuity Right Eye Distance:   Left Eye Distance:   Bilateral Distance:    Right Eye Near:   Left Eye Near:    Bilateral Near:     Physical Exam HENT:     Head: Normocephalic.  Cardiovascular:     Rate and Rhythm: Normal rate.  Pulmonary:     Effort: Pulmonary effort is normal.  Musculoskeletal:     Right foot: Normal.     Left foot: Normal range of motion. No swelling, deformity, bunion, Charcot foot, foot drop, prominent metatarsal heads or tenderness. Normal pulse.  Skin:    Capillary Refill: Capillary refill takes less than 2 seconds.   Neurological:     General: No focal deficit present.     Mental Status: He is alert and oriented to person, place, and time.  Psychiatric:        Mood and Affect: Mood normal.      UC Treatments / Results  Labs (all labs ordered are listed, but only abnormal results are displayed) Labs Reviewed - No data to display  EKG   Radiology No results found.  Procedures Procedures (including critical care time)  Medications Ordered in UC Medications - No data to display  Initial Impression / Assessment and Plan / UC Course  I have reviewed the triage vital signs and the nursing notes.  Pertinent labs & imaging results that were available during my care of the patient were reviewed by me and considered in my medical decision making (see chart for details).     Left foot injury Final Clinical Impressions(s) / UC Diagnoses   Final diagnoses:  Puncture wound of left foot, subsequent encounter     Discharge Instructions      You have been seen today for a subsequent visit on your left foot puncture wound. You have reported that you did not complete the antibiotic that you were prescribed. You have been out of work but now require a work note to return.      ED Prescriptions   None    PDMP not reviewed this encounter.   Thad Ranger [place], [place] 02/25/24 1535

## 2024-02-25 NOTE — ED Triage Notes (Signed)
 Pt states he is looking to get a return to work note for nail that went into his left foot on 02/19/24.

## 2024-02-25 NOTE — Discharge Instructions (Addendum)
 You have been seen today for a subsequent visit on your left foot puncture wound. You have reported that you did not complete the antibiotic that you were prescribed. You have been out of work but now require a work note to return.

## 2024-03-10 ENCOUNTER — Emergency Department

## 2024-03-10 ENCOUNTER — Other Ambulatory Visit: Payer: Self-pay

## 2024-03-10 ENCOUNTER — Emergency Department
Admission: EM | Admit: 2024-03-10 | Discharge: 2024-03-10 | Disposition: A | Discharge status: Home / Self Care | Attending: Emergency Medicine | Admitting: Emergency Medicine

## 2024-03-10 DIAGNOSIS — M79642 Pain in left hand: Secondary | ICD-10-CM | POA: Diagnosis present

## 2024-03-10 DIAGNOSIS — S6722XA Crushing injury of left hand, initial encounter: Secondary | ICD-10-CM | POA: Diagnosis not present

## 2024-03-10 DIAGNOSIS — W28XXXA Contact with powered lawn mower, initial encounter: Secondary | ICD-10-CM | POA: Diagnosis not present

## 2024-03-10 NOTE — ED Provider Notes (Signed)
 [facility] Provider Note   Event Date/Time   First MD Initiated Contact with Patient 03/10/24 [phone]     (approximate) History  Hand Injury  HPI Neil Lane is a 23 y.o. male with no stated past medical history who presents after a lawnmower fell sideways onto his left hand resulting in significant dorsal pain of the left hand that is worsened with trying to grip anything.  Patient denies any pop or crack.  Patient still has full use of this hand with pain.  Patient's range of motion is not limited in the fingers or at the wrist.  Patient denies any head injury, loss of consciousness, or subsequent memory issues or nausea/vomiting ROS: Patient currently denies any vision changes, tinnitus, difficulty speaking, facial droop, sore throat, chest pain, shortness of breath, abdominal pain, nausea/vomiting/diarrhea, dysuria, or weakness/numbness/paresthesias in any extremity   Physical Exam  Triage Vital Signs: ED Triage Vitals  Encounter Vitals Group     BP 03/10/24 0824 (!) 150/85     Systolic BP Percentile --      Diastolic BP Percentile --      Pulse Rate 03/10/24 0824 67     Resp 03/10/24 0824 18     Temp 03/10/24 0824 98.1 F (36.7 C)     Temp src --      SpO2 03/10/24 0824 99 %     Weight 03/10/24 0823 230 lb (104.3 kg)     Height 03/10/24 0823 5\' 5"  (1.651 m)     Head Circumference --      Peak Flow --      Pain Score 03/10/24 0823 2     Pain Loc --      Pain Education --      Exclude from Growth Chart --    Most recent vital signs: Vitals:   03/10/24 0824  BP: (!) 150/85  Pulse: 67  Resp: 18  Temp: 98.1 F (36.7 C)  SpO2: 99%   General: Awake, oriented x4. CV:  Good peripheral perfusion.  Resp:  Normal effort.  Abd:  No distention.  Other:  Young adult overweight Caucasian male resting comfortably in no acute distress.  Erythema over index and middle MCP joint with mild tenderness to palpation over the entirety of the dorsum of the left  hand.  Grip strength intact.  Intrinsic muscle strength intact ED Results / Procedures / Treatments  Labs (all labs ordered are listed, but only abnormal results are displayed) Labs Reviewed - No data to display RADIOLOGY ED MD interpretation: Left hand x-ray independently interpreted and shows no evidence of fracture or dislocation -Agree with radiology assessment Official radiology report(s): No results found. PROCEDURES: Critical Care performed: No Procedures MEDICATIONS ORDERED IN ED: Medications - No data to display IMPRESSION / MDM / ASSESSMENT AND PLAN / ED COURSE  I reviewed the triage vital signs and the nursing notes.                             The patient is on the cardiac monitor to evaluate for evidence of arrhythmia and/or significant heart rate changes. Patient's presentation is most consistent with acute presentation with potential threat to life or bodily function. 23 year old male presents after a crush injury to the left hand 1 day prior to arrival Given history, exam and workup I have low suspicion for fracture, dislocation, significant ligamentous injury, septic arthritis, gout flare, new autoimmune arthropathy, or  gonococcal arthropathy.  Interventions: X-ray of the left hand shows no evidence of acute fracture or dislocation Disposition: Discharge home with strict return precautions and instructions for prompt primary care follow up in the next week.   FINAL CLINICAL IMPRESSION(S) / ED DIAGNOSES   Final diagnoses:  Crushing injury of left hand, initial encounter   Rx / DC Orders   ED Discharge Orders     None      Note:  This document was prepared using Dragon voice recognition software and may include unintentional dictation errors.   Pricila Bridge K, MD 03/10/24 1026

## 2024-03-10 NOTE — Discharge Instructions (Addendum)
 Please use ibuprofen (Motrin) up to 800 mg every 8 hours, naproxen (Naprosyn) up to 500 mg every 12 hours, and/or acetaminophen (Tylenol) up to 4 g/day for any continued pain.  Please do not use this medication regimen for longer than 7 days

## 2024-03-10 NOTE — ED Triage Notes (Signed)
 Pt comes with left hand pain. Pt states he got it smashed by a mower yesterday. Pt states some redness and swelling noted.

## 2024-03-10 NOTE — ED Notes (Signed)
 See triage notes. Patient was loading his mower into his truck when it rolled back on him yesterday. Patient c/o pain in his left hand when any pressure is applied to it.

## 2024-03-11 ENCOUNTER — Emergency Department

## 2024-03-11 ENCOUNTER — Encounter: Payer: Self-pay | Admitting: Emergency Medicine

## 2024-03-11 ENCOUNTER — Emergency Department
Admission: EM | Admit: 2024-03-11 | Discharge: 2024-03-11 | Disposition: A | Discharge status: Home / Self Care | Attending: Emergency Medicine | Admitting: Emergency Medicine

## 2024-03-11 DIAGNOSIS — M25562 Pain in left knee: Secondary | ICD-10-CM | POA: Diagnosis present

## 2024-03-11 DIAGNOSIS — M898X0 Other specified disorders of bone, multiple sites: Secondary | ICD-10-CM

## 2024-03-11 DIAGNOSIS — Q786 Multiple congenital exostoses: Secondary | ICD-10-CM | POA: Insufficient documentation

## 2024-03-11 NOTE — Discharge Instructions (Addendum)
 Please use ibuprofen (Motrin) up to 800 mg every 8 hours, naproxen (Naprosyn) up to 500 mg every 12 hours, and/or acetaminophen (Tylenol) up to 4 g/day for any continued pain.  Please do not use this medication regimen for longer than 7 days

## 2024-03-11 NOTE — ED Triage Notes (Signed)
 Patient to ED via POV for left knee pain. PT reports getting injured at work. Ambulatory to triage.

## 2024-03-11 NOTE — ED Provider Notes (Signed)
 [facility] Provider Note   Event Date/Time   First MD Initiated Contact with Patient 03/11/24 [phone]     (approximate) History  Knee Injury  HPI Neil Lane is a 23 y.o. 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  Patient states that he has been having to limp on this side secondary to pain since this injury.  Patient is concerned that may have a fracture.  Patient denies any weakness/numbness/paresthesias distally ROS: Patient currently denies any vision changes, tinnitus, difficulty speaking, facial droop, sore throat, chest pain, shortness of breath, abdominal pain, nausea/vomiting/diarrhea, dysuria, or weakness/numbness/paresthesias in any extremity   Physical Exam  Triage Vital Signs: ED Triage Vitals  Encounter Vitals Group     BP 03/11/24 0757 (!) 163/104     Systolic BP Percentile --      Diastolic BP Percentile --      Pulse Rate 03/11/24 0757 (!) 56     Resp 03/11/24 0757 17     Temp 03/11/24 0757 98.2 F (36.8 C)     Temp Source 03/11/24 0757 Oral     SpO2 03/11/24 0757 97 %     Weight 03/11/24 0756 230 lb (104.3 kg)     Height 03/11/24 0756 5\' 7"  (1.702 m)     Head Circumference --      Peak Flow --      Pain Score 03/11/24 0756 2     Pain Loc --      Pain Education --      Exclude from Growth Chart --    Most recent vital signs: Vitals:   03/11/24 0757  BP: (!) 163/104  Pulse: (!) 56  Resp: 17  Temp: 98.2 F (36.8 C)  SpO2: 97%   General: Awake, oriented x4. CV:  Good peripheral perfusion.  Resp:  Normal effort.  Abd:  No distention.  Other:  Young adult obese Caucasian male resting comfortably in no acute distress.  Tenderness to palpation over the lateral inferior aspect of the left knee.  No laxity in the anterior or posterior drawer test.  Lachman's shows no laxity ED Results / Procedures / Treatments   Labs (all labs ordered are listed, but only abnormal results are displayed) Labs Reviewed - No data to display RADIOLOGY ED MD interpretation: X-ray of the left knee independently interpreted and shows no acute fracture or dislocation.  There is multiple osteochondromas arising from the distal femur, proximal tibia, and proximal fibula -Agree with radiology assessment Official radiology report(s): DG Knee Complete 4 Views Left Result Date: 03/11/2024 CLINICAL DATA:  Left knee pain.  Injury. EXAM: LEFT KNEE - COMPLETE 4+ VIEW COMPARISON:  None Available. FINDINGS: No acute fracture or dislocation. No significant knee joint effusion. The joint spaces are normal. There are multiple osteochondromas arising from the distal femur, proximal tibia, and proximal fibula. No erosive change. IMPRESSION: 1. No acute fracture or dislocation of the left knee. 2. 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 Electronically Signed   By: Chadwick Colonel M.D.   On: 03/11/2024 09:55   PROCEDURES: Critical Care performed: No Procedures MEDICATIONS ORDERED IN ED: Medications - No data to display IMPRESSION / MDM / ASSESSMENT AND PLAN / ED COURSE  I reviewed the triage vital signs and the nursing notes.  The patient is on the cardiac monitor to evaluate for evidence of arrhythmia and/or significant heart rate changes. Patient's presentation is most consistent with acute presentation with potential threat to life or bodily function. 23 year old male presents for left knee pain after being struck with a large metal beam Given history, exam and workup I have low suspicion for fracture, dislocation, significant ligamentous injury, septic arthritis, gout flare, new autoimmune arthropathy, or gonococcal arthropathy.  Interventions: X-ray of the left knee shows no evidence of acute  fracture or dislocation however does show incidentally found osteochondromas.  In discussion with patient, he has had diagnosis of osteochondromas of the right knee in the past Disposition: Discharge home with strict return precautions and instructions for prompt primary care follow up in the next week.   FINAL CLINICAL IMPRESSION(S) / ED DIAGNOSES   Final diagnoses:  Acute pain of left knee  Exostoses, multiple   Rx / DC Orders   ED Discharge Orders     None      Note:  This document was prepared using Dragon voice recognition software and may include unintentional dictation errors.   Charleen Conn, MD 03/11/24 1026

## 2024-04-11 ENCOUNTER — Ambulatory Visit: Payer: Self-pay | Admitting: Nurse Practitioner

## 2024-06-06 ENCOUNTER — Emergency Department (HOSPITAL_COMMUNITY)
Admission: EM | Admit: 2024-06-06 | Discharge: 2024-06-06 | Disposition: A | Discharge status: Home / Self Care | Payer: Self-pay | Attending: Emergency Medicine | Admitting: Emergency Medicine

## 2024-06-06 ENCOUNTER — Other Ambulatory Visit: Payer: Self-pay

## 2024-06-06 ENCOUNTER — Emergency Department (HOSPITAL_COMMUNITY): Payer: Self-pay

## 2024-06-06 ENCOUNTER — Encounter (HOSPITAL_COMMUNITY): Payer: Self-pay | Admitting: Emergency Medicine

## 2024-06-06 DIAGNOSIS — R109 Unspecified abdominal pain: Secondary | ICD-10-CM | POA: Insufficient documentation

## 2024-06-06 DIAGNOSIS — R1110 Vomiting, unspecified: Secondary | ICD-10-CM | POA: Insufficient documentation

## 2024-06-06 LAB — URINALYSIS, ROUTINE W REFLEX MICROSCOPIC
Bacteria, UA: NONE SEEN
Bilirubin Urine: NEGATIVE
Glucose, UA: NEGATIVE mg/dL
Ketones, ur: NEGATIVE mg/dL
Leukocytes,Ua: NEGATIVE
Nitrite: NEGATIVE
Protein, ur: 30 mg/dL — AB
RBC / HPF: >50 RBC/hpf (ref 0–5)
Specific Gravity, Urine: 1.018 (ref 1.005–1.030)
pH: 6 (ref 5.0–8.0)

## 2024-06-06 MED ORDER — KETOROLAC TROMETHAMINE 60 MG/2ML IM SOLN
60.0000 mg | Freq: Once | INTRAMUSCULAR | Status: AC
  Administered 2024-06-06: 60 mg via INTRAMUSCULAR
  Filled 2024-06-06: qty 2

## 2024-06-06 NOTE — ED Triage Notes (Signed)
 Pt c/o lower back pain x 30 minutes pta.

## 2024-06-06 NOTE — Discharge Instructions (Addendum)
Take ibuprofen 600 mg every 6 hours as needed for pain.  Follow-up with primary doctor if not improving in the next week, and return to the ER if symptoms significantly worsen or change.

## 2024-06-06 NOTE — ED Provider Notes (Signed)
 [place] [facility] AT [facility] Provider Note   CSN: [id number] Arrival date & time: 06/06/24  9886     Patient presents with: Back Pain   Neil Lane. is a 23 y.o. male.   Patient is a 23 year old male with history of ADHD.  Patient presenting today with complaints of right-sided back pain.  This started suddenly this evening as he was lying down to go to sleep.  This began in the absence of any injury or trauma.  He describes a severe pain along with nausea and multiple episodes of vomiting.  No fevers or chills.  He denies any urinary complaints.  He denies history of prior kidney stone.       Prior to Admission medications   Medication Sig Start Date End Date Taking? Authorizing Provider  albuterol  (VENTOLIN  HFA) 108 (90 Base) MCG/ACT inhaler Inhale 2 puffs into the lungs every 6 (six) hours as needed for wheezing or shortness of breath. 09/10/20   Angelena Smalls, MD  amoxicillin -clavulanate (AUGMENTIN ) 875-125 MG tablet Take 1 tablet by mouth every 12 (twelve) hours. 02/20/24   Leath-Warren, Etta PARAS, NP  benzonatate  (TESSALON ) 100 MG capsule Take 1 capsule (100 mg total) by mouth 3 (three) times daily as needed for cough. Do not take with alcohol or while driving or operating heavy machinery.  May cause drowsiness. 10/04/23   Chandra Harlene LABOR, NP  cloNIDine (CATAPRES) 0.2 MG tablet Take 0.2 mg by mouth once.    [provider]  dexmethylphenidate (FOCALIN XR) 20 MG 24 hr capsule Take 20 mg by mouth daily.    [provider]  dexmethylphenidate (FOCALIN) 5 MG tablet Take 5 mg by mouth 2 (two) times daily.    [provider]  dicyclomine  (BENTYL ) 20 MG tablet Take 1 tablet (20 mg total) by mouth 2 (two) times daily. 07/05/22   Theotis Peers M, PA-C  methocarbamol  (ROBAXIN ) 500 MG tablet Take 1 tablet (500 mg total) by mouth 2 (two) times daily as needed for muscle spasms. 08/12/23   Cleotilde Rogue, MD  ondansetron   (ZOFRAN -ODT) 4 MG disintegrating tablet Take 1 tablet (4 mg total) by mouth every 8 (eight) hours as needed for nausea or vomiting. 07/05/22   Theotis Peers HERO, PA-C    Allergies: Oxcarbazepine and Tricyclic antidepressants    Review of Systems  All other systems reviewed and are negative.   Updated Vital Signs BP (!) 153/93 (BP Location: Right Arm)   Pulse 82   Temp 98.8 F (37.1 C) (Oral)   Resp 20   Ht 5' 7 (1.702 m)   Wt 104.3 kg   SpO2 100%   BMI 36.02 kg/m   Physical Exam Vitals and nursing note reviewed.  Constitutional:      General: He is not in acute distress.    Appearance: He is well-developed. He is not diaphoretic.  HENT:     Head: Normocephalic and atraumatic.  Cardiovascular:     Rate and Rhythm: Normal rate and regular rhythm.     Heart sounds: No murmur heard.    No friction rub.  Pulmonary:     Effort: Pulmonary effort is normal. No respiratory distress.     Breath sounds: Normal breath sounds. No wheezing or rales.  Abdominal:     General: Bowel sounds are normal. There is no distension.     Palpations: Abdomen is soft.     Tenderness: There is no abdominal tenderness. There is right CVA tenderness.  Musculoskeletal:  General: Normal range of motion.     Cervical back: Normal range of motion and neck supple.  Skin:    General: Skin is warm and dry.  Neurological:     Mental Status: He is alert and oriented to person, place, and time.     Coordination: Coordination normal.     (all labs ordered are listed, but only abnormal results are displayed) Labs Reviewed  URINALYSIS, ROUTINE W REFLEX MICROSCOPIC    EKG: None  Radiology: No results found.   Procedures   Medications Ordered in the ED  ketorolac (TORADOL) injection 60 mg (has no administration in time range)                                    Medical Decision Making Amount and/or Complexity of Data Reviewed Labs: ordered. Radiology: ordered.  Risk Prescription  drug management.   Patient is a 23 year old male presenting with right sided back pain that started suddenly this evening.  Patient arrives here with stable vital signs and is afebrile.  Physical examination reveals mild right-sided CVA tenderness and tenderness in the soft tissues of his low back, but is otherwise unremarkable.  Strength is 5 out of 5 throughout both lower extremities.  Urinalysis shows microscopic hematuria.  Renal CT scan shows no acute process.  Patient reports severe pain upon presentation along with multiple episodes of vomiting in triage.  His presentation was most consistent with a renal calculus.  I suspect he had a stone that passed prior to my evaluation.  There was a prolonged wait time this evening due to patient volume and acuity.  He did receive Toradol here in the ER and seems to be feeling better.  He will be discharged with NSAIDs, rest, and follow-up as needed.     Final diagnoses:  None    ED Discharge Orders     None          Geroldine Berg, MD 06/06/24 [phone]
